# Patient Record
Sex: Female | Born: 1935 | Race: Black or African American | Hispanic: No | Smoking: Never smoker
Health system: Southern US, Community
[De-identification: ages and names within clinical notes are randomized; demographics above are authoritative.]

## PROBLEM LIST (undated history)

## (undated) ENCOUNTER — Emergency Department (HOSPITAL_BASED_OUTPATIENT_CLINIC_OR_DEPARTMENT_OTHER): Admission: EM | Payer: Medicare Other | Source: Home / Self Care

## (undated) DIAGNOSIS — I10 Essential (primary) hypertension: Secondary | ICD-10-CM

## (undated) DIAGNOSIS — K922 Gastrointestinal hemorrhage, unspecified: Secondary | ICD-10-CM

## (undated) DIAGNOSIS — N289 Disorder of kidney and ureter, unspecified: Secondary | ICD-10-CM

## (undated) HISTORY — PX: ABDOMINAL SURGERY: SHX537

## (undated) HISTORY — PX: ANKLE SURGERY: SHX546

## (undated) HISTORY — PX: ABDOMINAL HYSTERECTOMY: SHX81

## (undated) HISTORY — PX: APPENDECTOMY: SHX54

---

## 2008-10-06 ENCOUNTER — Ambulatory Visit: Payer: Self-pay | Admitting: Diagnostic Radiology

## 2008-10-06 ENCOUNTER — Emergency Department (HOSPITAL_BASED_OUTPATIENT_CLINIC_OR_DEPARTMENT_OTHER): Admission: EM | Admit: 2008-10-06 | Discharge: 2008-10-06 | Payer: Self-pay | Admitting: Emergency Medicine

## 2009-01-15 ENCOUNTER — Emergency Department (HOSPITAL_BASED_OUTPATIENT_CLINIC_OR_DEPARTMENT_OTHER): Admission: EM | Admit: 2009-01-15 | Discharge: 2009-01-16 | Payer: Self-pay | Admitting: Emergency Medicine

## 2009-01-16 ENCOUNTER — Ambulatory Visit: Payer: Self-pay | Admitting: Diagnostic Radiology

## 2009-02-20 ENCOUNTER — Ambulatory Visit: Payer: Self-pay | Admitting: Diagnostic Radiology

## 2009-02-20 ENCOUNTER — Ambulatory Visit (HOSPITAL_BASED_OUTPATIENT_CLINIC_OR_DEPARTMENT_OTHER): Admission: RE | Admit: 2009-02-20 | Discharge: 2009-02-20 | Payer: Self-pay | Admitting: Internal Medicine

## 2010-02-26 ENCOUNTER — Ambulatory Visit (HOSPITAL_BASED_OUTPATIENT_CLINIC_OR_DEPARTMENT_OTHER)
Admission: RE | Admit: 2010-02-26 | Discharge: 2010-02-26 | Payer: Self-pay | Source: Home / Self Care | Attending: Internal Medicine | Admitting: Internal Medicine

## 2010-05-28 LAB — URINALYSIS, ROUTINE W REFLEX MICROSCOPIC
Glucose, UA: NEGATIVE mg/dL
Protein, ur: NEGATIVE mg/dL
Specific Gravity, Urine: 1.005 (ref 1.005–1.030)
pH: 5.5 (ref 5.0–8.0)

## 2010-05-28 LAB — URINE CULTURE
Colony Count: NO GROWTH
Culture: NO GROWTH

## 2010-05-28 LAB — CBC
HCT: 34.3 % — ABNORMAL LOW (ref 36.0–46.0)
MCHC: 33.9 g/dL (ref 30.0–36.0)
MCV: 90.3 fL (ref 78.0–100.0)
RBC: 3.79 MIL/uL — ABNORMAL LOW (ref 3.87–5.11)
WBC: 5.1 10*3/uL (ref 4.0–10.5)

## 2010-05-28 LAB — BASIC METABOLIC PANEL
BUN: 16 mg/dL (ref 6–23)
CO2: 22 mEq/L (ref 19–32)
Chloride: 98 mEq/L (ref 96–112)
GFR calc Af Amer: 37 mL/min — ABNORMAL LOW (ref >60)
Potassium: 4.6 mEq/L (ref 3.5–5.1)

## 2010-05-28 LAB — DIFFERENTIAL
Eosinophils Absolute: 0.1 10*3/uL (ref 0.0–0.7)
Eosinophils Relative: 2 % (ref 0–5)
Lymphs Abs: 2.4 10*3/uL (ref 0.7–4.0)
Monocytes Relative: 7 % (ref 3–12)

## 2010-06-01 LAB — BASIC METABOLIC PANEL
BUN: 22 mg/dL (ref 6–23)
GFR calc non Af Amer: 22 mL/min — ABNORMAL LOW (ref >60)
Glucose, Bld: 100 mg/dL — ABNORMAL HIGH (ref 70–99)
Potassium: 5.6 mEq/L — ABNORMAL HIGH (ref 3.5–5.1)

## 2010-06-01 LAB — BASIC METABOLIC PANEL WITH GFR
CO2: 28 meq/L (ref 19–32)
Calcium: 9.7 mg/dL (ref 8.4–10.5)
Chloride: 105 meq/L (ref 96–112)
Creatinine, Ser: 2.1 mg/dL — ABNORMAL HIGH (ref 0.4–1.2)
GFR calc Af Amer: 27 mL/min — ABNORMAL LOW (ref >60)
Sodium: 140 meq/L (ref 135–145)

## 2010-06-01 LAB — POCT CARDIAC MARKERS
CKMB, poc: <1 ng/mL — ABNORMAL LOW (ref 1.0–8.0)
Myoglobin, poc: 175 ng/mL (ref 12–200)
Troponin i, poc: <0.05 ng/mL (ref 0.00–0.09)

## 2010-06-01 LAB — CBC
HCT: 32 % — ABNORMAL LOW (ref 36.0–46.0)
Hemoglobin: 10.5 g/dL — ABNORMAL LOW (ref 12.0–15.0)
MCHC: 32.8 g/dL (ref 30.0–36.0)
MCV: 92.2 fL (ref 78.0–100.0)
Platelets: 229 10*3/uL (ref 150–400)
RBC: 3.47 MIL/uL — ABNORMAL LOW (ref 3.87–5.11)
RDW: 13.1 % (ref 11.5–15.5)
WBC: 4.4 K/uL (ref 4.0–10.5)

## 2010-06-01 LAB — DIFFERENTIAL
Basophils Absolute: 0 10*3/uL (ref 0.0–0.1)
Basophils Relative: 1 % (ref 0–1)
Eosinophils Absolute: 0.1 10*3/uL (ref 0.0–0.7)
Eosinophils Relative: 2 % (ref 0–5)
Lymphocytes Relative: 37 % (ref 12–46)
Lymphs Abs: 1.6 K/uL (ref 0.7–4.0)
Monocytes Absolute: 0.4 K/uL (ref 0.1–1.0)
Monocytes Relative: 8 % (ref 3–12)
Neutro Abs: 2.2 K/uL (ref 1.7–7.7)
Neutrophils Relative %: 52 % (ref 43–77)

## 2010-06-01 LAB — POTASSIUM: Potassium: 5.4 mEq/L — ABNORMAL HIGH (ref 3.5–5.1)

## 2011-01-23 ENCOUNTER — Other Ambulatory Visit (HOSPITAL_BASED_OUTPATIENT_CLINIC_OR_DEPARTMENT_OTHER): Payer: Self-pay | Admitting: Internal Medicine

## 2011-01-23 DIAGNOSIS — Z1231 Encounter for screening mammogram for malignant neoplasm of breast: Secondary | ICD-10-CM

## 2011-03-05 ENCOUNTER — Ambulatory Visit (HOSPITAL_BASED_OUTPATIENT_CLINIC_OR_DEPARTMENT_OTHER)
Admission: RE | Admit: 2011-03-05 | Discharge: 2011-03-05 | Disposition: A | Discharge status: Home / Self Care | Payer: Medicare Other | Source: Ambulatory Visit | Attending: Internal Medicine | Admitting: Internal Medicine

## 2011-03-05 ENCOUNTER — Ambulatory Visit (HOSPITAL_BASED_OUTPATIENT_CLINIC_OR_DEPARTMENT_OTHER): Payer: Self-pay

## 2011-03-05 DIAGNOSIS — Z1231 Encounter for screening mammogram for malignant neoplasm of breast: Secondary | ICD-10-CM | POA: Insufficient documentation

## 2011-03-09 ENCOUNTER — Encounter (HOSPITAL_BASED_OUTPATIENT_CLINIC_OR_DEPARTMENT_OTHER): Payer: Self-pay | Admitting: Family Medicine

## 2011-03-09 ENCOUNTER — Emergency Department (INDEPENDENT_AMBULATORY_CARE_PROVIDER_SITE_OTHER): Payer: Medicare Other

## 2011-03-09 ENCOUNTER — Emergency Department (HOSPITAL_BASED_OUTPATIENT_CLINIC_OR_DEPARTMENT_OTHER)
Admission: EM | Admit: 2011-03-09 | Discharge: 2011-03-09 | Disposition: A | Discharge status: Home / Self Care | Payer: Medicare Other | Attending: Emergency Medicine | Admitting: Emergency Medicine

## 2011-03-09 ENCOUNTER — Other Ambulatory Visit: Payer: Self-pay

## 2011-03-09 DIAGNOSIS — R300 Dysuria: Secondary | ICD-10-CM | POA: Insufficient documentation

## 2011-03-09 DIAGNOSIS — R079 Chest pain, unspecified: Secondary | ICD-10-CM | POA: Insufficient documentation

## 2011-03-09 DIAGNOSIS — I10 Essential (primary) hypertension: Secondary | ICD-10-CM | POA: Insufficient documentation

## 2011-03-09 DIAGNOSIS — IMO0001 Reserved for inherently not codable concepts without codable children: Secondary | ICD-10-CM | POA: Insufficient documentation

## 2011-03-09 DIAGNOSIS — N390 Urinary tract infection, site not specified: Secondary | ICD-10-CM | POA: Insufficient documentation

## 2011-03-09 HISTORY — DX: Disorder of kidney and ureter, unspecified: N28.9

## 2011-03-09 HISTORY — DX: Gastrointestinal hemorrhage, unspecified: K92.2

## 2011-03-09 HISTORY — DX: Essential (primary) hypertension: I10

## 2011-03-09 LAB — CARDIAC PANEL(CRET KIN+CKTOT+MB+TROPI)
CK, MB: 1.9 ng/mL (ref 0.3–4.0)
Relative Index: 1.5 (ref 0.0–2.5)
Troponin I: <0.3 ng/mL (ref <0.30)
Troponin I: <0.3 ng/mL (ref <0.30)

## 2011-03-09 LAB — COMPREHENSIVE METABOLIC PANEL
ALT: 17 U/L (ref 0–35)
Alkaline Phosphatase: 83 U/L (ref 39–117)
CO2: 25 mEq/L (ref 19–32)
Chloride: 107 mEq/L (ref 96–112)
GFR calc Af Amer: 28 mL/min — ABNORMAL LOW (ref >90)
GFR calc non Af Amer: 24 mL/min — ABNORMAL LOW (ref >90)
Glucose, Bld: 87 mg/dL (ref 70–99)
Potassium: 4.9 mEq/L (ref 3.5–5.1)
Sodium: 141 mEq/L (ref 135–145)

## 2011-03-09 LAB — URINE CULTURE
Culture  Setup Time: 201301150525
Culture: NO GROWTH

## 2011-03-09 LAB — CBC
Hemoglobin: 11.3 g/dL — ABNORMAL LOW (ref 12.0–15.0)
RBC: 3.85 MIL/uL — ABNORMAL LOW (ref 3.87–5.11)
WBC: 6.4 10*3/uL (ref 4.0–10.5)

## 2011-03-09 LAB — URINE MICROSCOPIC-ADD ON

## 2011-03-09 LAB — URINALYSIS, ROUTINE W REFLEX MICROSCOPIC
Bilirubin Urine: NEGATIVE
Ketones, ur: NEGATIVE mg/dL
Nitrite: NEGATIVE
Urobilinogen, UA: 0.2 mg/dL (ref 0.0–1.0)
pH: 5.5 (ref 5.0–8.0)

## 2011-03-09 MED ORDER — CEPHALEXIN 500 MG PO CAPS: 500.0000 mg | ORAL_CAPSULE | Freq: Four times a day (QID) | ORAL | Status: AC

## 2011-03-09 MED ORDER — CEPHALEXIN 250 MG PO CAPS
500.0000 mg | ORAL_CAPSULE | Freq: Once | ORAL | Status: AC
  Administered 2011-03-09: 500 mg via ORAL
  Filled 2011-03-09: qty 2

## 2011-03-09 MED ORDER — ASPIRIN 81 MG PO CHEW
324.0000 mg | CHEWABLE_TABLET | Freq: Once | ORAL | Status: AC
  Administered 2011-03-09: 324 mg via ORAL
  Filled 2011-03-09: qty 4

## 2011-03-09 NOTE — ED Notes (Addendum)
Pt c/o "hurting all over" since yesterday and "burning in my arms and hurting some in my chest onset today. Pt is poor historian. Pt lives alone and cares for herself. Pt sts she cannot recall if she took her meds today or not, pt sts she took meds yesterday.

## 2011-03-09 NOTE — ED Provider Notes (Signed)
History  This chart was scribed for Mia Numbers, MD by Bennett Scrape. This patient was seen in room MH03/MH03 and the patient's care was started at 5:12PM.  CSN: [id number]  Arrival date & time 03/09/11  1602   First MD Initiated Contact with Patient 03/09/11 1650      Chief Complaint  Patient presents with  . Generalized Body Aches    The history is provided by the patient. No language interpreter was used.    Mia Garcia is a 78 y.o. female who presents to the Emergency Department complaining of one week of bilateral arm pain described as a burning sensation with associated generalized body aches, mild non-productive cough and sternally located intermittent chest pain. She is not currently having chest pain now. She reports that the last time she had chest pain was 3 hours ago. She states that her symptoms have been worse since yesterday. She denies any modifying factors. She has not taken any medications to improve pain at home. Pt states that the symptoms she is experiencing now are feels different from the symptoms she experiences with heart burn. She has not seen her PCP for the symptoms. She denies having any sick contacts at home. She denies rash, SOB, nausea, vomiting and diarrhea as associated symptoms. She denies having a  h/o MI or heart stents. She states that she had a mammogram 4 days ago and is still awaiting the results.  She has a h/o HTN but doesn't remember if she has taken her medications the past few days. She denies smoking and alcohol use. She has had some recent dysuria but is planning on following up with her OB/GYN.  Pt's PCP is Dr. Ron Agee.   Past Medical History  Diagnosis Date  . Hypertension   . Renal disorder   . GI bleed     Past Surgical History  Procedure Date  . Abdominal surgery   . Abdominal hysterectomy   . Appendectomy     History reviewed. No pertinent family history.  History  Substance Use Topics  . Smoking status: Never Smoker     . Smokeless tobacco: Not on file  . Alcohol Use: No    Review of Systems  Constitutional: Negative for fever and chills.  HENT: Negative for congestion, sore throat and neck pain.   Eyes: Negative for itching.  Respiratory: Positive for cough. Negative for shortness of breath.   Cardiovascular: Positive for chest pain.  Gastrointestinal: Negative for nausea, vomiting, abdominal pain and diarrhea.  Genitourinary: Positive for dysuria. Negative for urgency and hematuria.  Musculoskeletal: Negative for back pain.  Skin: Negative for rash.  Neurological: Negative for weakness and headaches.    Allergies  Review of patient's allergies indicates no known allergies.  Home Medications   Current Outpatient Rx  Name Route Sig Dispense Refill  . ASPIRIN EC 81 MG PO TBEC Oral Take 81 mg by mouth daily.    Marland Kitchen LISINOPRIL 20 MG PO TABS Oral Take 20 mg by mouth daily.    . ADULT MULTIVITAMIN W/MINERALS CH Oral Take 1 tablet by mouth daily.    Marland Kitchen OMEPRAZOLE 20 MG PO CPDR Oral Take 20 mg by mouth daily.      Triage Vitals: BP 158/69  Pulse 85  Temp(Src) 98.5 F (36.9 C) (Oral)  Ht 5\' 4"  (1.626 m)  Wt 160 lb (72.576 kg)  BMI 27.46 kg/m2  SpO2 100%  Physical Exam  Nursing note and vitals reviewed. Constitutional: She is oriented to person, place, and  time. She appears well-developed and well-nourished.  HENT:  Head: Normocephalic and atraumatic.  Eyes: Conjunctivae and EOM are normal.  Neck: Normal range of motion. Neck supple.  Cardiovascular: Normal rate, regular rhythm and normal heart sounds.   Pulmonary/Chest: Effort normal and breath sounds normal. No respiratory distress.  Abdominal: Soft. There is no tenderness.  Musculoskeletal: Normal range of motion. She exhibits no edema.  Neurological: She is alert and oriented to person, place, and time. No cranial nerve deficit.  Skin: Skin is warm and dry. No rash noted.  Psychiatric: She has a normal mood and affect. Her behavior is  normal.    ED Course  Procedures (including critical care time)   Date: 03/09/2011  Rate: 79  Rhythm: normal sinus rhythm  QRS Axis: normal  Intervals: normal  ST/T Wave abnormalities: normal  Conduction Disutrbances:none  Narrative Interpretation:   Old EKG Reviewed: unchanged  DIAGNOSTIC STUDIES: Oxygen Saturation is 100% on room air, normal by my interpretation.    COORDINATION OF CARE: 5:19PM-Discussed urinalysis and chest x-ray with pt and pt agreed to plan.   Labs Reviewed  CBC - Abnormal; Notable for the following:    RBC 3.85 (*)    Hemoglobin 11.3 (*)    HCT 35.4 (*)    All other components within normal limits  COMPREHENSIVE METABOLIC PANEL - Abnormal; Notable for the following:    Creatinine, Ser 1.80 (*)    GFR calc non Af Amer 24 (*)    GFR calc Af Amer 28 (*)    All other components within normal limits  URINALYSIS, ROUTINE W REFLEX MICROSCOPIC - Abnormal; Notable for the following:    Leukocytes, UA SMALL (*)    All other components within normal limits  URINE MICROSCOPIC-ADD ON - Abnormal; Notable for the following:    Bacteria, UA FEW (*)    All other components within normal limits  CARDIAC PANEL(CRET KIN+CKTOT+MB+TROPI)  CARDIAC PANEL(CRET KIN+CKTOT+MB+TROPI)  URINE CULTURE   Dg Chest 2 View  03/09/2011  *RADIOLOGY REPORT*  Clinical Data: Diffuse pain including chest pain.  CHEST - 2 VIEW  Comparison: 01/16/2009  Findings: The heart size and vascularity are normal and the lungs are clear.  No effusions.  No acute osseous abnormality.  IMPRESSION: No acute disease in the chest.  Original Report Authenticated By: Gwynn Burly, M.D.     1. UTI (lower urinary tract infection)   2. Chest pain       MDM  Patient was evaluated. Given her presentation and had very low suspicion for ACS. However given that the patient is female and also in her 68s we did perform cardiac workup. EKG was unremarkable as was chest x-ray. Laboratory workup was  negative including negative markers at 0 and 3 hours. Patient did receive aspirin while awaiting results. She remains chest pain-free while here in the emergency department. Given her dysuria a urinalysis was performed and was positive for UTI. She was treated with Keflex. She'll be discharged with 10 days of this. Urine culture was also sent. Patient was told to followup with her regular Dr. Patient was discharged home in good condition.     I personally performed the services described in this documentation, which was scribed in my presence. The recorded information has been reviewed and considered.      Mia Numbers, MD 03/10/11 [phone]

## 2011-09-11 ENCOUNTER — Encounter (HOSPITAL_BASED_OUTPATIENT_CLINIC_OR_DEPARTMENT_OTHER): Payer: Self-pay | Admitting: *Deleted

## 2011-09-11 ENCOUNTER — Emergency Department (HOSPITAL_BASED_OUTPATIENT_CLINIC_OR_DEPARTMENT_OTHER): Payer: Medicare Other

## 2011-09-11 ENCOUNTER — Emergency Department (HOSPITAL_BASED_OUTPATIENT_CLINIC_OR_DEPARTMENT_OTHER)
Admission: EM | Admit: 2011-09-11 | Discharge: 2011-09-11 | Disposition: A | Discharge status: Other Acute Inpatient Hospital | Payer: Medicare Other | Attending: Emergency Medicine | Admitting: Emergency Medicine

## 2011-09-11 DIAGNOSIS — Z9089 Acquired absence of other organs: Secondary | ICD-10-CM | POA: Insufficient documentation

## 2011-09-11 DIAGNOSIS — I10 Essential (primary) hypertension: Secondary | ICD-10-CM | POA: Insufficient documentation

## 2011-09-11 DIAGNOSIS — R079 Chest pain, unspecified: Secondary | ICD-10-CM

## 2011-09-11 LAB — CBC WITH DIFFERENTIAL/PLATELET
Basophils Absolute: 0 10*3/uL (ref 0.0–0.1)
Basophils Relative: 1 % (ref 0–1)
HCT: 32.9 % — ABNORMAL LOW (ref 36.0–46.0)
Lymphocytes Relative: 42 % (ref 12–46)
MCHC: 32.8 g/dL (ref 30.0–36.0)
Monocytes Absolute: 0.4 10*3/uL (ref 0.1–1.0)
Neutro Abs: 2.2 10*3/uL (ref 1.7–7.7)
Neutrophils Relative %: 45 % (ref 43–77)
RDW: 13 % (ref 11.5–15.5)
WBC: 4.8 10*3/uL (ref 4.0–10.5)

## 2011-09-11 LAB — CK TOTAL AND CKMB (NOT AT ARMC): CK, MB: 1.9 ng/mL (ref 0.3–4.0)

## 2011-09-11 LAB — URINALYSIS, ROUTINE W REFLEX MICROSCOPIC
Glucose, UA: NEGATIVE mg/dL
Hgb urine dipstick: NEGATIVE
Protein, ur: NEGATIVE mg/dL
Specific Gravity, Urine: 1.005 (ref 1.005–1.030)

## 2011-09-11 LAB — URINE MICROSCOPIC-ADD ON

## 2011-09-11 LAB — COMPREHENSIVE METABOLIC PANEL
ALT: 17 U/L (ref 0–35)
AST: 27 U/L (ref 0–37)
Albumin: 3.4 g/dL — ABNORMAL LOW (ref 3.5–5.2)
Alkaline Phosphatase: 82 U/L (ref 39–117)
CO2: 30 mEq/L (ref 19–32)
Chloride: 100 mEq/L (ref 96–112)
GFR calc non Af Amer: 17 mL/min — ABNORMAL LOW (ref >90)
Potassium: 4.4 mEq/L (ref 3.5–5.1)
Sodium: 138 mEq/L (ref 135–145)
Total Bilirubin: 0.4 mg/dL (ref 0.3–1.2)

## 2011-09-11 MED ORDER — GI COCKTAIL ~~LOC~~
30.0000 mL | Freq: Once | ORAL | Status: AC
  Administered 2011-09-11: 30 mL via ORAL
  Filled 2011-09-11: qty 30

## 2011-09-11 MED ORDER — ASPIRIN 81 MG PO CHEW
324.0000 mg | CHEWABLE_TABLET | Freq: Once | ORAL | Status: AC
  Administered 2011-09-11: 324 mg via ORAL
  Filled 2011-09-11: qty 4

## 2011-09-11 MED ORDER — NITROGLYCERIN 0.4 MG SL SUBL
0.4000 mg | SUBLINGUAL_TABLET | SUBLINGUAL | Status: DC | PRN
  Administered 2011-09-11 (×2): 0.4 mg via SUBLINGUAL
  Filled 2011-09-11: qty 25

## 2011-09-11 NOTE — ED Notes (Addendum)
Pt reports onset of intermittent sharp chest pain that occurred at 1600 today. Stated nothing makes it worse or better. States that the pain is throughout her chest but denies the pain radiating to any other area. Stated that when it first occurred she, "got hot." Stated that at 1600 it was 10 out of 10 and currently is 7 out of 10. Denies N/V, diaphoresis and shortness of breath.

## 2011-09-11 NOTE — ED Provider Notes (Signed)
History     CSN: [id number]  Arrival date & time 09/11/11  1914   First MD Initiated Contact with Patient 09/11/11 1948      Chief Complaint  Patient presents with  . Chest Pain    (Consider location/radiation/quality/duration/timing/severity/associated sxs/prior treatment) HPI  Chest pain began this evening about 4 PM. She states it feels like indigestion. It radiates across her chest. She does not have any dyspnea or lightheadedness or sweating with this. She thinks that she may have had one episode around a year ago but does not think that she was in the hospital for this. Patient lives alone but is here with family.  Past Medical History  Diagnosis Date  . Hypertension   . Renal disorder   . GI bleed     Past Surgical History  Procedure Date  . Abdominal surgery   . Abdominal hysterectomy   . Appendectomy     No family history on file.  History  Substance Use Topics  . Smoking status: Never Smoker   . Smokeless tobacco: Not on file  . Alcohol Use: No    OB History    Grav Para Term Preterm Abortions TAB SAB Ect Mult Living                  Review of Systems  All other systems reviewed and are negative.    Allergies  Review of patient's allergies indicates no known allergies.  Home Medications   Current Outpatient Rx  Name Route Sig Dispense Refill  . ALUM & MAG HYDROXIDE-SIMETH 500-450-40 MG/5ML PO SUSP Oral Take 5 mLs by mouth every 6 (six) hours as needed. For heartburn.    . ASPIRIN EC 81 MG PO TBEC Oral Take 81 mg by mouth daily.    Marland Kitchen LANSOPRAZOLE 15 MG PO CPDR Oral Take 15 mg by mouth daily. For acid reflux.    . ADULT MULTIVITAMIN W/MINERALS CH Oral Take 1 tablet by mouth daily.    Marland Kitchen PRAVASTATIN SODIUM 40 MG PO TABS Oral Take 40 mg by mouth daily.      BP 150/75  Pulse 88  Temp 98.3 F (36.8 C) (Oral)  Resp 20  SpO2 98%  Physical Exam  Nursing note and vitals reviewed. Constitutional: She appears well-developed and well-nourished.    HENT:  Head: Normocephalic and atraumatic.  Eyes: Conjunctivae and EOM are normal. Pupils are equal, round, and reactive to light.  Neck: Normal range of motion. Neck supple.  Cardiovascular: Normal rate, regular rhythm, normal heart sounds and intact distal pulses.   Pulmonary/Chest: Effort normal and breath sounds normal.  Abdominal: Soft. Bowel sounds are normal.  Musculoskeletal: Normal range of motion.  Neurological: She is alert.       Patient alert and oriented but has some intermittent confusion and forgetfulness.  Skin: Skin is warm and dry.  Psychiatric: She has a normal mood and affect. Thought content normal.    ED Course  Procedures (including critical care time)  Labs Reviewed  CBC WITH DIFFERENTIAL - Abnormal; Notable for the following:    RBC 3.61 (*)     Hemoglobin 10.8 (*)     HCT 32.9 (*)     All other components within normal limits  COMPREHENSIVE METABOLIC PANEL - Abnormal; Notable for the following:    Glucose, Bld 105 (*)     Creatinine, Ser 2.40 (*)     Albumin 3.4 (*)     GFR calc non Af Amer 17 (*)  GFR calc Af Amer 20 (*)     All other components within normal limits  URINALYSIS, ROUTINE W REFLEX MICROSCOPIC - Abnormal; Notable for the following:    Leukocytes, UA SMALL (*)     All other components within normal limits  CK TOTAL AND CKMB  TROPONIN I  URINE MICROSCOPIC-ADD ON   Dg Chest Port 1 View  09/11/2011  *RADIOLOGY REPORT*  Clinical Data: Mid chest pain  PORTABLE CHEST - 1 VIEW  Comparison: 03/09/2011  Findings: Lungs are clear. No pleural effusion or pneumothorax.  The heart is top normal in size/mildly enlarged.  IMPRESSION: No evidence of acute cardiopulmonary disease.  Stable borderline cardiomegaly.  Original Report Authenticated By: Charline Bills, M.D.     No diagnosis found.    Date: 09/11/2011  Rate: 81  Rhythm: normal sinus rhythm  QRS Axis: normal  Intervals: normal  ST/T Wave abnormalities: nonspecific ST changes   Conduction Disutrbances:none  Narrative Interpretation:   Old EKG Reviewed: none available Dg Chest Port 1 View  09/11/2011  *RADIOLOGY REPORT*  Clinical Data: Mid chest pain  PORTABLE CHEST - 1 VIEW  Comparison: 03/09/2011  Findings: Lungs are clear. No pleural effusion or pneumothorax.  The heart is top normal in size/mildly enlarged.  IMPRESSION: No evidence of acute cardiopulmonary disease.  Stable borderline cardiomegaly.  Original Report Authenticated By: Charline Bills, M.D.    Results for orders placed during the hospital encounter of 09/11/11  CBC WITH DIFFERENTIAL      Component Value Range   WBC 4.8  4.0 - 10.5 K/uL   RBC 3.61 (*) 3.87 - 5.11 MIL/uL   Hemoglobin 10.8 (*) 12.0 - 15.0 g/dL   HCT 16.1 (*) 09.6 - 04.5 %   MCV 91.1  78.0 - 100.0 fL   MCH 29.9  26.0 - 34.0 pg   MCHC 32.8  30.0 - 36.0 g/dL   RDW 40.9  81.1 - 91.4 %   Platelets 188  150 - 400 K/uL   Neutrophils Relative 45  43 - 77 %   Neutro Abs 2.2  1.7 - 7.7 K/uL   Lymphocytes Relative 42  12 - 46 %   Lymphs Abs 2.0  0.7 - 4.0 K/uL   Monocytes Relative 9  3 - 12 %   Monocytes Absolute 0.4  0.1 - 1.0 K/uL   Eosinophils Relative 3  0 - 5 %   Eosinophils Absolute 0.1  0.0 - 0.7 K/uL   Basophils Relative 1  0 - 1 %   Basophils Absolute 0.0  0.0 - 0.1 K/uL  COMPREHENSIVE METABOLIC PANEL      Component Value Range   Sodium 138  135 - 145 mEq/L   Potassium 4.4  3.5 - 5.1 mEq/L   Chloride 100  96 - 112 mEq/L   CO2 30  19 - 32 mEq/L   Glucose, Bld 105 (*) 70 - 99 mg/dL   BUN 22  6 - 23 mg/dL   Creatinine, Ser 7.82 (*) 0.50 - 1.10 mg/dL   Calcium 9.4  8.4 - 95.6 mg/dL   Total Protein 7.0  6.0 - 8.3 g/dL   Albumin 3.4 (*) 3.5 - 5.2 g/dL   AST 27  0 - 37 U/L   ALT 17  0 - 35 U/L   Alkaline Phosphatase 82  39 - 117 U/L   Total Bilirubin 0.4  0.3 - 1.2 mg/dL   GFR calc non Af Amer 17 (*) >90 mL/min   GFR calc Af  Amer 20 (*) >90 mL/min  CK TOTAL AND CKMB      Component Value Range   Total CK 94  7 - 177 U/L     CK, MB 1.9  0.3 - 4.0 ng/mL   Relative Index RELATIVE INDEX IS INVALID  0.0 - 2.5  TROPONIN I      Component Value Range   Troponin I <0.30  <0.30 ng/mL  URINALYSIS, ROUTINE W REFLEX MICROSCOPIC      Component Value Range   Color, Urine YELLOW  YELLOW   APPearance CLEAR  CLEAR   Specific Gravity, Urine 1.005  1.005 - 1.030   pH 7.5  5.0 - 8.0   Glucose, UA NEGATIVE  NEGATIVE mg/dL   Hgb urine dipstick NEGATIVE  NEGATIVE   Bilirubin Urine NEGATIVE  NEGATIVE   Ketones, ur NEGATIVE  NEGATIVE mg/dL   Protein, ur NEGATIVE  NEGATIVE mg/dL   Urobilinogen, UA 0.2  0.0 - 1.0 mg/dL   Nitrite NEGATIVE  NEGATIVE   Leukocytes, UA SMALL (*) NEGATIVE  URINE MICROSCOPIC-ADD ON      Component Value Range   Squamous Epithelial / LPF RARE  RARE   WBC, UA 3-6  <3 WBC/hpf   Bacteria, UA RARE  RARE   Results for orders placed during the hospital encounter of 09/11/11  CBC WITH DIFFERENTIAL      Component Value Range   WBC 4.8  4.0 - 10.5 K/uL   RBC 3.61 (*) 3.87 - 5.11 MIL/uL   Hemoglobin 10.8 (*) 12.0 - 15.0 g/dL   HCT 96.0 (*) 45.4 - 09.8 %   MCV 91.1  78.0 - 100.0 fL   MCH 29.9  26.0 - 34.0 pg   MCHC 32.8  30.0 - 36.0 g/dL   RDW 11.9  14.7 - 82.9 %   Platelets 188  150 - 400 K/uL   Neutrophils Relative 45  43 - 77 %   Neutro Abs 2.2  1.7 - 7.7 K/uL   Lymphocytes Relative 42  12 - 46 %   Lymphs Abs 2.0  0.7 - 4.0 K/uL   Monocytes Relative 9  3 - 12 %   Monocytes Absolute 0.4  0.1 - 1.0 K/uL   Eosinophils Relative 3  0 - 5 %   Eosinophils Absolute 0.1  0.0 - 0.7 K/uL   Basophils Relative 1  0 - 1 %   Basophils Absolute 0.0  0.0 - 0.1 K/uL  COMPREHENSIVE METABOLIC PANEL      Component Value Range   Sodium 138  135 - 145 mEq/L   Potassium 4.4  3.5 - 5.1 mEq/L   Chloride 100  96 - 112 mEq/L   CO2 30  19 - 32 mEq/L   Glucose, Bld 105 (*) 70 - 99 mg/dL   BUN 22  6 - 23 mg/dL   Creatinine, Ser 5.62 (*) 0.50 - 1.10 mg/dL   Calcium 9.4  8.4 - 13.0 mg/dL   Total Protein 7.0  6.0 - 8.3  g/dL   Albumin 3.4 (*) 3.5 - 5.2 g/dL   AST 27  0 - 37 U/L   ALT 17  0 - 35 U/L   Alkaline Phosphatase 82  39 - 117 U/L   Total Bilirubin 0.4  0.3 - 1.2 mg/dL   GFR calc non Af Amer 17 (*) >90 mL/min   GFR calc Af Amer 20 (*) >90 mL/min  CK TOTAL AND CKMB      Component Value Range   Total  CK 94  7 - 177 U/L   CK, MB 1.9  0.3 - 4.0 ng/mL   Relative Index RELATIVE INDEX IS INVALID  0.0 - 2.5  TROPONIN I      Component Value Range   Troponin I <0.30  <0.30 ng/mL  URINALYSIS, ROUTINE W REFLEX MICROSCOPIC      Component Value Range   Color, Urine YELLOW  YELLOW   APPearance CLEAR  CLEAR   Specific Gravity, Urine 1.005  1.005 - 1.030   pH 7.5  5.0 - 8.0   Glucose, UA NEGATIVE  NEGATIVE mg/dL   Hgb urine dipstick NEGATIVE  NEGATIVE   Bilirubin Urine NEGATIVE  NEGATIVE   Ketones, ur NEGATIVE  NEGATIVE mg/dL   Protein, ur NEGATIVE  NEGATIVE mg/dL   Urobilinogen, UA 0.2  0.0 - 1.0 mg/dL   Nitrite NEGATIVE  NEGATIVE   Leukocytes, UA SMALL (*) NEGATIVE  URINE MICROSCOPIC-ADD ON      Component Value Range   Squamous Epithelial / LPF RARE  RARE   WBC, UA 3-6  <3 WBC/hpf   Bacteria, UA RARE  RARE     MDM  Patient with waxing and waning pain and is difficult to evaluate here. She has a no acute changes on her EKG and troponin is normal. She has been given aspirin and 2 subungual nitroglycerin and GI cocktail. Her creatinine is elevated at 2.4 and that it is reported that she has a history of renal insufficiency but there is no baseline creatinine to compare this to. She is also slightly anemic with a hemoglobin of 10.8 and again there is no hemoglobin available for comparison. I discussed patient's care with Dr. Thera Flake at [facility] and patient will be transferred there to a monitored bed. Patient remained hemodynamically stable here and currently has no complaints.        Hilario Quarry, MD 09/11/11 [phone]

## 2011-09-11 NOTE — ED Notes (Signed)
Pt. Is only reporting a "burning" in the R side of her chest.  Pt. Reports she has no pain.  Pt. Tells Dr. Rosalia Hammers that the pain is a 7/10 and then tells RN Earlene Plater that she is burning because I gave her the NTG under her tongue.  Pt. Niece is at bedside.  Pt. Speaks at times like she has dementia.

## 2011-09-11 NOTE — ED Notes (Signed)
Chest pain feels like indigestion x 3 hours. Hx of same pain that her MD gave her medication to make her food digest.

## 2012-02-01 ENCOUNTER — Other Ambulatory Visit (HOSPITAL_BASED_OUTPATIENT_CLINIC_OR_DEPARTMENT_OTHER): Payer: Self-pay | Admitting: Internal Medicine

## 2012-02-01 DIAGNOSIS — Z1231 Encounter for screening mammogram for malignant neoplasm of breast: Secondary | ICD-10-CM

## 2012-03-08 ENCOUNTER — Ambulatory Visit (HOSPITAL_BASED_OUTPATIENT_CLINIC_OR_DEPARTMENT_OTHER)
Admission: RE | Admit: 2012-03-08 | Discharge: 2012-03-08 | Disposition: A | Discharge status: Home / Self Care | Payer: Medicare Other | Source: Ambulatory Visit | Attending: Internal Medicine | Admitting: Internal Medicine

## 2012-03-08 DIAGNOSIS — Z1231 Encounter for screening mammogram for malignant neoplasm of breast: Secondary | ICD-10-CM | POA: Insufficient documentation

## 2013-01-05 ENCOUNTER — Emergency Department (HOSPITAL_BASED_OUTPATIENT_CLINIC_OR_DEPARTMENT_OTHER)
Admission: EM | Admit: 2013-01-05 | Discharge: 2013-01-05 | Disposition: A | Discharge status: Other Acute Inpatient Hospital | Payer: Medicare Other | Attending: Emergency Medicine | Admitting: Emergency Medicine

## 2013-01-05 ENCOUNTER — Encounter (HOSPITAL_BASED_OUTPATIENT_CLINIC_OR_DEPARTMENT_OTHER): Payer: Self-pay | Admitting: Emergency Medicine

## 2013-01-05 ENCOUNTER — Emergency Department (HOSPITAL_BASED_OUTPATIENT_CLINIC_OR_DEPARTMENT_OTHER): Payer: Medicare Other

## 2013-01-05 DIAGNOSIS — R079 Chest pain, unspecified: Secondary | ICD-10-CM | POA: Insufficient documentation

## 2013-01-05 DIAGNOSIS — Z8719 Personal history of other diseases of the digestive system: Secondary | ICD-10-CM | POA: Insufficient documentation

## 2013-01-05 DIAGNOSIS — Z79899 Other long term (current) drug therapy: Secondary | ICD-10-CM | POA: Insufficient documentation

## 2013-01-05 DIAGNOSIS — Z87448 Personal history of other diseases of urinary system: Secondary | ICD-10-CM | POA: Insufficient documentation

## 2013-01-05 DIAGNOSIS — Z7982 Long term (current) use of aspirin: Secondary | ICD-10-CM | POA: Insufficient documentation

## 2013-01-05 DIAGNOSIS — R51 Headache: Secondary | ICD-10-CM | POA: Insufficient documentation

## 2013-01-05 DIAGNOSIS — M542 Cervicalgia: Secondary | ICD-10-CM | POA: Insufficient documentation

## 2013-01-05 DIAGNOSIS — R059 Cough, unspecified: Secondary | ICD-10-CM | POA: Insufficient documentation

## 2013-01-05 DIAGNOSIS — I10 Essential (primary) hypertension: Secondary | ICD-10-CM | POA: Insufficient documentation

## 2013-01-05 DIAGNOSIS — R05 Cough: Secondary | ICD-10-CM | POA: Insufficient documentation

## 2013-01-05 LAB — CBC WITH DIFFERENTIAL/PLATELET
Basophils Absolute: 0 10*3/uL (ref 0.0–0.1)
Eosinophils Absolute: 0.3 10*3/uL (ref 0.0–0.7)
Eosinophils Relative: 5 % (ref 0–5)
HCT: 32.3 % — ABNORMAL LOW (ref 36.0–46.0)
Hemoglobin: 10.6 g/dL — ABNORMAL LOW (ref 12.0–15.0)
Lymphocytes Relative: 45 % (ref 12–46)
Lymphs Abs: 2.2 10*3/uL (ref 0.7–4.0)
MCH: 30 pg (ref 26.0–34.0)
MCHC: 32.8 g/dL (ref 30.0–36.0)
MCV: 91.5 fL (ref 78.0–100.0)
Monocytes Absolute: 0.6 10*3/uL (ref 0.1–1.0)
Monocytes Relative: 11 % (ref 3–12)
Platelets: 158 10*3/uL (ref 150–400)
RBC: 3.53 MIL/uL — ABNORMAL LOW (ref 3.87–5.11)

## 2013-01-05 LAB — COMPREHENSIVE METABOLIC PANEL
ALT: 20 U/L (ref 0–35)
AST: 33 U/L (ref 0–37)
BUN: 18 mg/dL (ref 6–23)
CO2: 25 mEq/L (ref 19–32)
Calcium: 9.5 mg/dL (ref 8.4–10.5)
GFR calc Af Amer: 32 mL/min — ABNORMAL LOW (ref >90)
GFR calc non Af Amer: 28 mL/min — ABNORMAL LOW (ref >90)
Glucose, Bld: 111 mg/dL — ABNORMAL HIGH (ref 70–99)
Potassium: 3.7 mEq/L (ref 3.5–5.1)
Total Bilirubin: 0.5 mg/dL (ref 0.3–1.2)
Total Protein: 7 g/dL (ref 6.0–8.3)

## 2013-01-05 LAB — D-DIMER, QUANTITATIVE: D-Dimer, Quant: 0.52 ug/mL-FEU — ABNORMAL HIGH (ref 0.00–0.48)

## 2013-01-05 LAB — TROPONIN I: Troponin I: <0.3 ng/mL (ref <0.30)

## 2013-01-05 MED ORDER — SODIUM CHLORIDE 0.9 % IV SOLN
INTRAVENOUS | Status: DC
  Administered 2013-01-05: 20:00:00 via INTRAVENOUS

## 2013-01-05 MED ORDER — ASPIRIN 81 MG PO CHEW
162.0000 mg | CHEWABLE_TABLET | Freq: Once | ORAL | Status: AC
  Administered 2013-01-05: 162 mg via ORAL
  Filled 2013-01-05: qty 2

## 2013-01-05 NOTE — ED Notes (Signed)
Report given to carelinke and Development worker, community at [facility]

## 2013-01-05 NOTE — ED Notes (Signed)
Patient changed into gown, waist up.  RN at bedside for triage.

## 2013-01-05 NOTE — ED Provider Notes (Signed)
CSN: [id number]     Arrival date & time 01/05/13  4098 History  This chart was scribed for Shelda Jakes, MD by Ronal Fear, ED Scribe. This patient was seen in room MH08/MH08 and the patient's care was started at 7:53 PM.    Chief Complaint  Patient presents with  . Chest Pain   (Consider location/radiation/quality/duration/timing/severity/associated sxs/prior Treatment) Patient is a 79 y.o. female presenting with chest pain. The history is provided by the patient. No language interpreter was used.  Chest Pain Pain location:  Substernal area Pain quality: sharp   Pain radiates to:  Does not radiate Pain radiates to the back: no   Pain severity:  Severe Onset quality:  Sudden Duration:  30 minutes Timing:  Constant Progression:  Resolved Chronicity:  Recurrent Relieved by: guinger ale. Worsened by:  Nothing tried Associated symptoms: cough and headache   Associated symptoms: no abdominal pain, no back pain, no diaphoresis, no dizziness, no fever, no nausea, no shortness of breath and not vomiting   HPI Comments: Mia Garcia is a 79 y.o. female who presents to the Emergency Department complaining of sharp mid sternal 10/10 chest pain that did not radiate onset around 6pm tonight that resolved after drinking a ginger ale. Pt has had one prior occurrence that was indigestion. Pt was seen for CP in July at [facility] and was admitted.  She did not undergo a stress test or any other heart marker test. Pt denies nausea and vomiting.   Past Medical History  Diagnosis Date  . Hypertension   . Renal disorder   . GI bleed    Past Surgical History  Procedure Laterality Date  . Abdominal surgery    . Abdominal hysterectomy    . Appendectomy     No family history on file. History  Substance Use Topics  . Smoking status: Never Smoker   . Smokeless tobacco: Not on file  . Alcohol Use: No   OB History   Grav Para Term Preterm Abortions TAB SAB Ect Mult Living                  Review of Systems  Constitutional: Negative for fever, chills and diaphoresis.  HENT: Negative for congestion, rhinorrhea and sore throat.   Eyes: Negative for visual disturbance.  Respiratory: Positive for cough. Negative for shortness of breath.   Cardiovascular: Positive for chest pain. Negative for leg swelling.  Gastrointestinal: Negative for nausea, vomiting, abdominal pain and diarrhea.  Genitourinary: Negative for dysuria.  Musculoskeletal: Positive for neck pain. Negative for back pain and joint swelling.  Skin: Negative for rash.  Neurological: Positive for headaches. Negative for dizziness.  Hematological: Does not bruise/bleed easily.       Pt is not on blood thinners  All other systems reviewed and are negative.    Allergies  Review of patient's allergies indicates no known allergies.  Home Medications   Current Outpatient Rx  Name  Route  Sig  Dispense  Refill  . aluminum & magnesium hydroxide-simethicone (MYLANTA) 500-450-40 MG/5ML suspension   Oral   Take 5 mLs by mouth every 6 (six) hours as needed. For heartburn.         Marland Kitchen aspirin EC 81 MG tablet   Oral   Take 81 mg by mouth daily.         . lansoprazole (PREVACID) 15 MG capsule   Oral   Take 15 mg by mouth daily. For acid reflux.         Marland Kitchen  Multiple Vitamin (MULITIVITAMIN WITH MINERALS) TABS   Oral   Take 1 tablet by mouth daily.         . pravastatin (PRAVACHOL) 40 MG tablet   Oral   Take 40 mg by mouth daily.          BP 169/93  Pulse 73  Temp(Src) 97.9 F (36.6 C) (Oral)  Resp 12  Ht 5\' 2"  (1.575 m)  Wt 160 lb (72.576 kg)  BMI 29.26 kg/m2  SpO2 100% Physical Exam  Nursing note and vitals reviewed. Constitutional: She is oriented to person, place, and time. She appears well-developed and well-nourished. No distress.  HENT:  Head: Normocephalic and atraumatic.  Mouth/Throat: Oropharynx is clear and moist.  Eyes: Conjunctivae and EOM are normal. Pupils are equal,  round, and reactive to light.  Neck: Normal range of motion. Neck supple. No tracheal deviation present.  Cardiovascular: Normal rate, regular rhythm and normal heart sounds.   Pulmonary/Chest: Effort normal and breath sounds normal. No respiratory distress. She has no wheezes. She has no rales.  Abdominal: Soft. Bowel sounds are normal. There is no tenderness.  Musculoskeletal: Normal range of motion. She exhibits no edema.  Neurological: She is alert and oriented to person, place, and time. No cranial nerve deficit. She exhibits normal muscle tone. Coordination normal.  Skin: Skin is warm and dry.  Psychiatric: She has a normal mood and affect. Her behavior is normal.    ED Course  Procedures (including critical care time) DIAGNOSTIC STUDIES: Oxygen Saturation is 100% on RA, normal by my interpretation.    COORDINATION OF CARE:    7:58 PM- Pt advised of plan for treatment including admitting the pt to [facility] for observation, Chest X-ray, and screening test for blood clots in the lungs. Pt will be given more baby aspirin for her heart and pt agrees.  Labs Review Labs Reviewed  CBC WITH DIFFERENTIAL - Abnormal; Notable for the following:    RBC 3.53 (*)    Hemoglobin 10.6 (*)    HCT 32.3 (*)    Neutrophils Relative % 38 (*)    All other components within normal limits  COMPREHENSIVE METABOLIC PANEL - Abnormal; Notable for the following:    Glucose, Bld 111 (*)    Creatinine, Ser 1.60 (*)    Albumin 3.4 (*)    GFR calc non Af Amer 28 (*)    GFR calc Af Amer 32 (*)    All other components within normal limits  D-DIMER, QUANTITATIVE - Abnormal; Notable for the following:    D-Dimer, Quant 0.52 (*)    All other components within normal limits  TROPONIN I   Results for orders placed during the hospital encounter of 01/05/13  TROPONIN I      Result Value Range   Troponin I <0.30  <0.30 ng/mL  CBC WITH DIFFERENTIAL      Result Value Range   WBC 4.9  4.0 -  10.5 K/uL   RBC 3.53 (*) 3.87 - 5.11 MIL/uL   Hemoglobin 10.6 (*) 12.0 - 15.0 g/dL   HCT 14.7 (*) 82.9 - 56.2 %   MCV 91.5  78.0 - 100.0 fL   MCH 30.0  26.0 - 34.0 pg   MCHC 32.8  30.0 - 36.0 g/dL   RDW 13.0  86.5 - 78.4 %   Platelets 158  150 - 400 K/uL   Neutrophils Relative % 38 (*) 43 - 77 %   Neutro Abs 1.9  1.7 - 7.7  K/uL   Lymphocytes Relative 45  12 - 46 %   Lymphs Abs 2.2  0.7 - 4.0 K/uL   Monocytes Relative 11  3 - 12 %   Monocytes Absolute 0.6  0.1 - 1.0 K/uL   Eosinophils Relative 5  0 - 5 %   Eosinophils Absolute 0.3  0.0 - 0.7 K/uL   Basophils Relative 1  0 - 1 %   Basophils Absolute 0.0  0.0 - 0.1 K/uL  COMPREHENSIVE METABOLIC PANEL      Result Value Range   Sodium 138  135 - 145 mEq/L   Potassium 3.7  3.5 - 5.1 mEq/L   Chloride 103  96 - 112 mEq/L   CO2 25  19 - 32 mEq/L   Glucose, Bld 111 (*) 70 - 99 mg/dL   BUN 18  6 - 23 mg/dL   Creatinine, Ser 1.61 (*) 0.50 - 1.10 mg/dL   Calcium 9.5  8.4 - 09.6 mg/dL   Total Protein 7.0  6.0 - 8.3 g/dL   Albumin 3.4 (*) 3.5 - 5.2 g/dL   AST 33  0 - 37 U/L   ALT 20  0 - 35 U/L   Alkaline Phosphatase 77  39 - 117 U/L   Total Bilirubin 0.5  0.3 - 1.2 mg/dL   GFR calc non Af Amer 28 (*) >90 mL/min   GFR calc Af Amer 32 (*) >90 mL/min  D-DIMER, QUANTITATIVE      Result Value Range   D-Dimer, Quant 0.52 (*) 0.00 - 0.48 ug/mL-FEU    Imaging Review Dg Chest 2 View  01/05/2013   CLINICAL DATA:  Sharp midsternal chest pain.  EXAM: CHEST  2 VIEW  COMPARISON:  Chest radiograph performed 08/31/2011  FINDINGS: The lungs are well-aerated and clear. There is no evidence of focal opacification, pleural effusion or pneumothorax.  The heart is borderline normal in size; the mediastinal contour is within normal limits. No acute osseous abnormalities are seen. Clips are noted about the gastroesophageal junction.  IMPRESSION: No acute cardiopulmonary process seen.   Electronically Signed   By: Roanna Raider M.D.   On: 01/05/2013 21:33     EKG Interpretation     Ventricular Rate:  75 PR Interval:  142 QRS Duration: 86 QT Interval:  388 QTC Calculation: 433 R Axis:   63 Text Interpretation:  Sinus rhythm with Premature atrial complexes with Abberant conduction Otherwise normal ECG No significant change since last tracing except for PAC's            MDM   1. Chest pain    The patient has remained chest pain-free here in the emergency department. Patient had 30 minutes of chest pain around 6 PM. Very severe now resolved patient was given aspirin here. The pain was already gone. Patient similar episode of pain a year ago in July and was admitted to [facility]. Patient has no known coronary artery disease.  Patient's workup here without significant findings troponin was negative EKG without acute changes. Chest x-ray was negative for pneumonia congestive heart failure pneumothorax or pulmonary edema. Patient's d-dimer was slightly elevated however symptoms unlikely to be related to blood clot. Patient not eligible for CT angiogram of this out admitting physician made aware of [facility]. CT scan would be in order. Patient will be transferred to [facility] via [facility].   I personally performed the services described in this documentation, which was scribed in my presence. The recorded information has  been reviewed and is accurate.     Shelda Jakes, MD 01/05/13 2229

## 2013-01-05 NOTE — ED Notes (Signed)
Describe pain as sudden onset occurred at 1800 tonight when she lay down in bed. Sharp initially. No pain currently. Denied any associated symptoms such as N/V, SOB.

## 2013-01-05 NOTE — ED Notes (Signed)
Pt in xray

## 2013-01-05 NOTE — ED Notes (Signed)
Pt reports midsternal chest pain described as sharp and lasting 30 minutes at 1800.  States pain improved after ginger-ale.  Denies pain now.

## 2013-02-09 ENCOUNTER — Other Ambulatory Visit (HOSPITAL_BASED_OUTPATIENT_CLINIC_OR_DEPARTMENT_OTHER): Payer: Self-pay | Admitting: Internal Medicine

## 2013-02-09 DIAGNOSIS — Z1231 Encounter for screening mammogram for malignant neoplasm of breast: Secondary | ICD-10-CM

## 2013-03-10 ENCOUNTER — Ambulatory Visit (HOSPITAL_BASED_OUTPATIENT_CLINIC_OR_DEPARTMENT_OTHER)
Admission: RE | Admit: 2013-03-10 | Discharge: 2013-03-10 | Disposition: A | Discharge status: Home / Self Care | Payer: Medicare Other | Source: Ambulatory Visit | Attending: Internal Medicine | Admitting: Internal Medicine

## 2013-03-10 DIAGNOSIS — Z1231 Encounter for screening mammogram for malignant neoplasm of breast: Secondary | ICD-10-CM | POA: Insufficient documentation

## 2013-05-11 ENCOUNTER — Emergency Department (HOSPITAL_BASED_OUTPATIENT_CLINIC_OR_DEPARTMENT_OTHER)
Admission: EM | Admit: 2013-05-11 | Discharge: 2013-05-11 | Disposition: A | Discharge status: Home / Self Care | Payer: Medicare Other | Attending: Emergency Medicine | Admitting: Emergency Medicine

## 2013-05-11 ENCOUNTER — Encounter (HOSPITAL_BASED_OUTPATIENT_CLINIC_OR_DEPARTMENT_OTHER): Payer: Self-pay | Admitting: Emergency Medicine

## 2013-05-11 ENCOUNTER — Emergency Department (HOSPITAL_BASED_OUTPATIENT_CLINIC_OR_DEPARTMENT_OTHER): Payer: Medicare Other

## 2013-05-11 DIAGNOSIS — Z7982 Long term (current) use of aspirin: Secondary | ICD-10-CM | POA: Insufficient documentation

## 2013-05-11 DIAGNOSIS — Z87448 Personal history of other diseases of urinary system: Secondary | ICD-10-CM | POA: Insufficient documentation

## 2013-05-11 DIAGNOSIS — R42 Dizziness and giddiness: Secondary | ICD-10-CM | POA: Insufficient documentation

## 2013-05-11 DIAGNOSIS — Z8719 Personal history of other diseases of the digestive system: Secondary | ICD-10-CM | POA: Insufficient documentation

## 2013-05-11 DIAGNOSIS — Z79899 Other long term (current) drug therapy: Secondary | ICD-10-CM | POA: Insufficient documentation

## 2013-05-11 DIAGNOSIS — I10 Essential (primary) hypertension: Secondary | ICD-10-CM | POA: Insufficient documentation

## 2013-05-11 LAB — URINE MICROSCOPIC-ADD ON

## 2013-05-11 LAB — URINALYSIS, ROUTINE W REFLEX MICROSCOPIC
Bilirubin Urine: NEGATIVE
Glucose, UA: NEGATIVE mg/dL
Hgb urine dipstick: NEGATIVE
Ketones, ur: NEGATIVE mg/dL
NITRITE: NEGATIVE
PROTEIN: NEGATIVE mg/dL
Specific Gravity, Urine: 1.013 (ref 1.005–1.030)
UROBILINOGEN UA: 0.2 mg/dL (ref 0.0–1.0)
pH: 5.5 (ref 5.0–8.0)

## 2013-05-11 LAB — CBC WITH DIFFERENTIAL/PLATELET
BASOS ABS: 0 10*3/uL (ref 0.0–0.1)
Basophils Relative: 0 % (ref 0–1)
EOS ABS: 0.1 10*3/uL (ref 0.0–0.7)
EOS PCT: 3 % (ref 0–5)
HEMATOCRIT: 34.6 % — AB (ref 36.0–46.0)
Hemoglobin: 11.2 g/dL — ABNORMAL LOW (ref 12.0–15.0)
LYMPHS ABS: 1.9 10*3/uL (ref 0.7–4.0)
LYMPHS PCT: 42 % (ref 12–46)
MCH: 30.3 pg (ref 26.0–34.0)
MCHC: 32.4 g/dL (ref 30.0–36.0)
MCV: 93.5 fL (ref 78.0–100.0)
MONO ABS: 0.5 10*3/uL (ref 0.1–1.0)
Monocytes Relative: 10 % (ref 3–12)
Neutro Abs: 2 10*3/uL (ref 1.7–7.7)
Neutrophils Relative %: 44 % (ref 43–77)
Platelets: 219 10*3/uL (ref 150–400)
RBC: 3.7 MIL/uL — ABNORMAL LOW (ref 3.87–5.11)
RDW: 12.9 % (ref 11.5–15.5)
WBC: 4.5 10*3/uL (ref 4.0–10.5)

## 2013-05-11 LAB — COMPREHENSIVE METABOLIC PANEL
ALT: 24 U/L (ref 0–35)
AST: 32 U/L (ref 0–37)
Albumin: 3.6 g/dL (ref 3.5–5.2)
Alkaline Phosphatase: 74 U/L (ref 39–117)
BUN: 25 mg/dL — ABNORMAL HIGH (ref 6–23)
CALCIUM: 9.9 mg/dL (ref 8.4–10.5)
CO2: 27 meq/L (ref 19–32)
CREATININE: 1.8 mg/dL — AB (ref 0.50–1.10)
Chloride: 100 mEq/L (ref 96–112)
GFR calc non Af Amer: 24 mL/min — ABNORMAL LOW (ref >90)
GFR, EST AFRICAN AMERICAN: 28 mL/min — AB (ref >90)
GLUCOSE: 104 mg/dL — AB (ref 70–99)
Potassium: 4.3 mEq/L (ref 3.7–5.3)
Sodium: 140 mEq/L (ref 137–147)
Total Bilirubin: 0.6 mg/dL (ref 0.3–1.2)
Total Protein: 7.4 g/dL (ref 6.0–8.3)

## 2013-05-11 LAB — I-STAT CG4 LACTIC ACID, ED: Lactic Acid, Venous: 1.11 mmol/L (ref 0.5–2.2)

## 2013-05-11 LAB — PROTIME-INR
INR: 1.09 (ref 0.00–1.49)
PROTHROMBIN TIME: 13.9 s (ref 11.6–15.2)

## 2013-05-11 LAB — LIPASE, BLOOD: Lipase: 43 U/L (ref 11–59)

## 2013-05-11 NOTE — ED Notes (Signed)
C/o dizziness since 11am-denies pain -A/O

## 2013-05-11 NOTE — ED Notes (Signed)
MD at bedside. 

## 2013-05-11 NOTE — Discharge Instructions (Signed)
As discussed, your evaluation tonight has been largely reassuring.  However, with your dizziness is important that you followup with both your primary care physician and our neurology colleagues.  It is also very important to return here on Saturday for MRI to continue evaluation of your dizziness.  Be sure to return here immediately if you develop new, or concerning changes in your condition.   Dizziness Dizziness is a common problem. It is a feeling of unsteadiness or lightheadedness. You may feel like you are about to faint. Dizziness can lead to injury if you stumble or fall. A person of any age group can suffer from dizziness, but dizziness is more common in older adults. CAUSES  Dizziness can be caused by many different things, including:  Middle ear problems.  Standing for too long.  Infections.  An allergic reaction.  Aging.  An emotional response to something, such as the sight of blood.  Side effects of medicines.  Fatigue.  Problems with circulation or blood pressure.  Excess use of alcohol, medicines, or illegal drug use.  Breathing too fast (hyperventilation).  An arrhythmia or problems with your heart rhythm.  Low red blood cell count (anemia).  Pregnancy.  Vomiting, diarrhea, fever, or other illnesses that cause dehydration.  Diseases or conditions such as Parkinson's disease, high blood pressure (hypertension), diabetes, and thyroid problems.  Exposure to extreme heat. DIAGNOSIS  To find the cause of your dizziness, your caregiver may do a physical exam, lab tests, radiologic imaging scans, or an electrocardiography test (ECG).  TREATMENT  Treatment of dizziness depends on the cause of your symptoms and can vary greatly. HOME CARE INSTRUCTIONS   Drink enough fluids to keep your urine clear or pale yellow. This is especially important in very hot weather. In the elderly, it is also important in cold weather.  If your dizziness is caused by medicines,  take them exactly as directed. When taking blood pressure medicines, it is especially important to get up slowly.  Rise slowly from chairs and steady yourself until you feel okay.  In the morning, first sit up on the side of the bed. When this seems okay, stand slowly while holding onto something until you know your balance is fine.  If you need to stand in one place for a long time, be sure to move your legs often. Tighten and relax the muscles in your legs while standing.  If dizziness continues to be a problem, have someone stay with you for a day or two. Do this until you feel you are well enough to stay alone. Have the person call your caregiver if he or she notices changes in you that are concerning.  Do not drive or use heavy machinery if you feel dizzy.  Do not drink alcohol. SEEK IMMEDIATE MEDICAL CARE IF:   Your dizziness or lightheadedness gets worse.  You feel nauseous or vomit.  You develop problems with talking, walking, weakness, or using your arms, hands, or legs.  You are not thinking clearly or you have difficulty forming sentences. It may take a friend or family member to determine if your thinking is normal.  You develop chest pain, abdominal pain, shortness of breath, or sweating.  Your vision changes.  You notice any bleeding.  You have side effects from medicine that seems to be getting worse rather than better. MAKE SURE YOU:   Understand these instructions.  Will watch your condition.  Will get help right away if you are not doing well or  get worse. Document Released: 08/05/2000 Document Revised: 05/04/2011 Document Reviewed: 08/29/2010 [facility] Patient Information 2014 [place], [place].

## 2013-05-11 NOTE — ED Notes (Signed)
Pt instructed by Molly Maduroobert in radiology that MRI will call her tomorrow and give her an appt for Saturday to come back for an MRI.  Pt voiced understanding.

## 2013-05-11 NOTE — ED Provider Notes (Signed)
CSN: [id number]     Arrival date & time 05/11/13  1829 History  This chart was scribed for Gerhard Munch, MD by Ardelia Mems, ED Scribe. This patient was seen in room MH07/MH07 and the patient's care was started at 7:15 PM.    Chief Complaint  Patient presents with  . Dizziness    The history is provided by the patient. No language interpreter was used.    HPI Comments: Mia Garcia is a 80 y.o. Female with a history of HTN who presents to the Emergency Department complaining of intermittent lightheadedness onset about 8 hours ago at 11 AM when pt reports she was getting ready for a funeral. She states that her lightheadedness has been mildly relieved by lying supine. She states that she has a remote history of similar lightheadedness, but none recently. She states that she is not having any pain. She denies weight changes, dyspnea, nausea, vomiting, diarrhea, cough, fever, dysuria or any other recent symptoms. She states that she lives alone   Past Medical History  Diagnosis Date  . Hypertension   . Renal disorder   . GI bleed    Past Surgical History  Procedure Laterality Date  . Abdominal surgery    . Abdominal hysterectomy    . Appendectomy    . Ankle surgery     No family history on file. History  Substance Use Topics  . Smoking status: Never Smoker   . Smokeless tobacco: Not on file  . Alcohol Use: No   OB History   Grav Para Term Preterm Abortions TAB SAB Ect Mult Living                 Review of Systems  Constitutional:       Per HPI, otherwise negative  HENT:       Per HPI, otherwise negative  Respiratory:       Per HPI, otherwise negative  Cardiovascular:       Per HPI, otherwise negative  Gastrointestinal: Negative for vomiting.  Endocrine:       Negative aside from HPI  Genitourinary:       Neg aside from HPI   Musculoskeletal:       Per HPI, otherwise negative  Skin: Negative.   Neurological: Negative for syncope.    Allergies  Review of  patient's allergies indicates no known allergies.  Home Medications   Current Outpatient Rx  Name  Route  Sig  Dispense  Refill  . Cholecalciferol (VITAMIN D PO)   Oral   Take by mouth.         . latanoprost (XALATAN) 0.005 % ophthalmic solution      1 drop at bedtime.         . metoprolol succinate (TOPROL-XL) 50 MG 24 hr tablet   Oral   Take 50 mg by mouth daily. Take with or immediately following a meal.         . aluminum & magnesium hydroxide-simethicone (MYLANTA) 500-450-40 MG/5ML suspension   Oral   Take 5 mLs by mouth every 6 (six) hours as needed. For heartburn.         Marland Kitchen aspirin EC 81 MG tablet   Oral   Take 81 mg by mouth daily.         . lansoprazole (PREVACID) 15 MG capsule   Oral   Take 15 mg by mouth daily. For acid reflux.         . Multiple Vitamin (MULITIVITAMIN WITH MINERALS) TABS  Oral   Take 1 tablet by mouth daily.         . pravastatin (PRAVACHOL) 40 MG tablet   Oral   Take 40 mg by mouth daily.          Triage Vitals: BP 143/76  Pulse 81  Temp(Src) 98.4 F (36.9 C) (Oral)  Resp 18  Ht 5\' 2"  (1.575 m)  Wt 160 lb (72.576 kg)  BMI 29.26 kg/m2  SpO2 100%  Physical Exam  Nursing note and vitals reviewed. Constitutional: She is oriented to person, place, and time. She appears well-developed and well-nourished. No distress.  HENT:  Head: Normocephalic and atraumatic.  Eyes: Conjunctivae and EOM are normal.  Cardiovascular: Normal rate, regular rhythm and intact distal pulses.   DP pulses are normal  Pulmonary/Chest: Effort normal and breath sounds normal. No stridor. No respiratory distress.  Abdominal: She exhibits no distension.  Musculoskeletal: She exhibits no edema.  Neurological: She is alert and oriented to person, place, and time. She has normal reflexes. She displays normal reflexes. No cranial nerve deficit. She exhibits normal muscle tone. Coordination normal.  5/5 and equal strength in upper extremities. No  facial asymmetry. No facial droop. Speech is clear  Skin: Skin is warm and dry.  Psychiatric: She has a normal mood and affect.    ED Course  Procedures (including critical care time)  DIAGNOSTIC STUDIES: Oxygen Saturation is 100% on RA, normal by my interpretation.    COORDINATION OF CARE: 7:20 PM- Discussed plan to obtain diagnostic lab work and radiology. Will also medications. Pt advised of plan for treatment and pt agrees.  Labs Review Labs Reviewed  CBC WITH DIFFERENTIAL - Abnormal; Notable for the following:    RBC 3.70 (*)    Hemoglobin 11.2 (*)    HCT 34.6 (*)    All other components within normal limits  COMPREHENSIVE METABOLIC PANEL - Abnormal; Notable for the following:    Glucose, Bld 104 (*)    BUN 25 (*)    Creatinine, Ser 1.80 (*)    GFR calc non Af Amer 24 (*)    GFR calc Af Amer 28 (*)    All other components within normal limits  LIPASE, BLOOD  PROTIME-INR  URINALYSIS, ROUTINE W REFLEX MICROSCOPIC  I-STAT CG4 LACTIC ACID, ED   Imaging Review Dg Chest 2 View  05/11/2013   CLINICAL DATA:  High blood pressure, dizziness  EXAM: CHEST  2 VIEW  COMPARISON:  01/06/2013  FINDINGS: Minimal enlargement of cardiac silhouette.  Minimally prominent right peritracheal soft tissues unchanged.  No mass effect upon trachea.  Mediastinal contours and pulmonary vascularity normal.  Lungs emphysematous but clear.  No pleural effusion or pneumothorax.  Scattered endplate spur formation thoracic spine.  IMPRESSION: Emphysematous changes.  Minimal enlargement of cardiac silhouette.  No acute abnormalities.   Electronically Signed   By: Ulyses SouthwardMark  Boles M.D.   On: 05/11/2013 20:58   Ct Head Wo Contrast  05/11/2013   CLINICAL DATA:  Dizziness.  EXAM: CT HEAD WITHOUT CONTRAST  TECHNIQUE: Contiguous axial images were obtained from the base of the skull through the vertex without contrast.  COMPARISON:  10/07/1998  FINDINGS: No evidence for acute hemorrhage, mass lesion, midline shift,  hydrocephalus or large infarct. There is mild cerebral atrophy which is likely age appropriate. Subtle low-density in the periventricular and subcortical white matter. There is fluid in the left mastoid air cells. No acute bone abnormality. The visualized paranasal sinuses are clear. Old fracture to the left  medial orbital wall.  IMPRESSION: No acute intracranial abnormality.  Fluid in left mastoid air cells.   Electronically Signed   By: Richarda Overlie M.D.   On: 05/11/2013 20:56     EKG Interpretation   Date/Time:  Thursday May 11 2013 19:45:57 EDT Ventricular Rate:  67 PR Interval:  150 QRS Duration: 76 QT Interval:  408 QTC Calculation: 431 R Axis:   42 Text Interpretation:  Normal sinus rhythm Normal ECG Sinus rhythm Normal  ECG Confirmed by Gerhard Munch  MD ([id number]) on 05/11/2013 8:54:47 PM     9:21 PM On exam the patient appears comfortable.  She has no new complaints.  She states that she feels markedly better.  She voices understanding of return precautions, follow up instructions.  Her colleague does the same. MDM   Final diagnoses:  Dizziness    I personally performed the services described in this documentation, which was scribed in my presence. The recorded information has been reviewed and is accurate.  This elderly female presents with concern.  On exam she is awake, alert, hemodynamically stable, neurologically intact.  Patient is in no distress.  Patient's evaluation is largely reassuring.  Patient endorses a history of prior similar episodes.  Patient's labs demonstrate mild renal dysfunction, which may be contributory.  Absent any distress, and was reassuring labs, imaging, patient was discharged in stable condition to continue evaluation as an outpatient.  Patient and her colleague who is understanding of instructions, precautions.    Gerhard Munch, MD 05/11/13 2122

## 2013-05-11 NOTE — ED Notes (Signed)
Patient transported to X-ray 

## 2013-05-13 ENCOUNTER — Ambulatory Visit (HOSPITAL_BASED_OUTPATIENT_CLINIC_OR_DEPARTMENT_OTHER)
Admission: RE | Admit: 2013-05-13 | Discharge: 2013-05-13 | Disposition: A | Discharge status: Home / Self Care | Payer: Medicare Other | Source: Ambulatory Visit | Attending: Emergency Medicine | Admitting: Emergency Medicine

## 2013-05-13 DIAGNOSIS — R42 Dizziness and giddiness: Secondary | ICD-10-CM | POA: Insufficient documentation

## 2013-05-16 ENCOUNTER — Encounter: Payer: Self-pay | Admitting: Neurology

## 2013-05-16 ENCOUNTER — Ambulatory Visit: Payer: Medicare Other | Admitting: Neurology

## 2013-05-16 ENCOUNTER — Ambulatory Visit (INDEPENDENT_AMBULATORY_CARE_PROVIDER_SITE_OTHER): Payer: Medicare Other | Admitting: Neurology

## 2013-05-16 VITALS — BP 136/77 | HR 87 | Ht 60.0 in | Wt 162.0 lb

## 2013-05-16 DIAGNOSIS — R42 Dizziness and giddiness: Secondary | ICD-10-CM

## 2013-05-16 DIAGNOSIS — R269 Unspecified abnormalities of gait and mobility: Secondary | ICD-10-CM | POA: Insufficient documentation

## 2013-05-16 NOTE — Patient Instructions (Signed)
I had a long discussion the patient and her friend regarding her dizziness and gait imbalance difficulties, discuss results of my evaluation, available imaging studies and answered questions. I recommend checking MRI scan of the cervical, thoracic and lumbar spine to evaluate for compressive myelopathy as well as referral to physical therapy for gait and balance training. We also talked about fall risk precautions. Return for followup in 2 months or call earlier if necessary.  Fall Prevention and Home Safety Falls cause injuries and can affect all age groups. It is possible to use preventive measures to significantly decrease the likelihood of falls. There are many simple measures which can make your home safer and prevent falls. OUTDOORS  Repair cracks and edges of walkways and driveways.  Remove high doorway thresholds.  Trim shrubbery on the main path into your home.  Have good outside lighting.  Clear walkways of tools, rocks, debris, and clutter.  Check that handrails are not broken and are securely fastened. Both sides of steps should have handrails.  Have leaves, snow, and ice cleared regularly.  Use sand or salt on walkways during winter months.  In the garage, clean up grease or oil spills. BATHROOM  Install night lights.  Install grab bars by the toilet and in the tub and shower.  Use non-skid mats or decals in the tub or shower.  Place a plastic non-slip stool in the shower to sit on, if needed.  Keep floors dry and clean up all water on the floor immediately.  Remove soap buildup in the tub or shower on a regular basis.  Secure bath mats with non-slip, double-sided rug tape.  Remove throw rugs and tripping hazards from the floors. BEDROOMS  Install night lights.  Make sure a bedside light is easy to reach.  Do not use oversized bedding.  Keep a telephone by your bedside.  Have a firm chair with side arms to use for getting dressed.  Remove throw rugs  and tripping hazards from the floor. KITCHEN  Keep handles on pots and pans turned toward the center of the stove. Use back burners when possible.  Clean up spills quickly and allow time for drying.  Avoid walking on wet floors.  Avoid hot utensils and knives.  Position shelves so they are not too high or low.  Place commonly used objects within easy reach.  If necessary, use a sturdy step stool with a grab bar when reaching.  Keep electrical cables out of the way.  Do not use floor polish or wax that makes floors slippery. If you must use wax, use non-skid floor wax.  Remove throw rugs and tripping hazards from the floor. STAIRWAYS  Never leave objects on stairs.  Place handrails on both sides of stairways and use them. Fix any loose handrails. Make sure handrails on both sides of the stairways are as long as the stairs.  Check carpeting to make sure it is firmly attached along stairs. Make repairs to worn or loose carpet promptly.  Avoid placing throw rugs at the top or bottom of stairways, or properly secure the rug with carpet tape to prevent slippage. Get rid of throw rugs, if possible.  Have an electrician put in a light switch at the top and bottom of the stairs. OTHER FALL PREVENTION TIPS  Wear low-heel or rubber-soled shoes that are supportive and fit well. Wear closed toe shoes.  When using a stepladder, make sure it is fully opened and both spreaders are firmly locked. Do not  climb a closed stepladder.  Add color or contrast paint or tape to grab bars and handrails in your home. Place contrasting color strips on first and last steps.  Learn and use mobility aids as needed. Install an electrical emergency response system.  Turn on lights to avoid dark areas. Replace light bulbs that burn out immediately. Get light switches that glow.  Arrange furniture to create clear pathways. Keep furniture in the same place.  Firmly attach carpet with non-skid or  double-sided tape.  Eliminate uneven floor surfaces.  Select a carpet pattern that does not visually hide the edge of steps.  Be aware of all pets. OTHER HOME SAFETY TIPS  Set the water temperature for 120 F (48.8 C).  Keep emergency numbers on or near the telephone.  Keep smoke detectors on every level of the home and near sleeping areas. Document Released: 01/30/2002 Document Revised: 08/11/2011 Document Reviewed: 05/01/2011 [facility] Patient Information 2014 [employer], [place].

## 2013-05-16 NOTE — Progress Notes (Signed)
[facility] [address] [place]. [place] [address] [phone]       OFFICE CONSULT NOTE  Mia. Mia Garcia Date of Birth:  06/10/1922 Medical Record Number:  [id number]   Referring MD:  Gerhard Munch  Reason for Referral:  dizziness  HPI: Mia Garcia is a 46 year african american lady who developed sudden onset of dizziness on 05/11/13. She describes a feeling of off balance this happened while she was changing her clothes. The film was intense and she had to lie down most of the day. She is unable to walk without holding onto something. She denied vertigo, nausea, blurred vision or focal extremity weakness or numbness. She was in the emergency room a CT scan of the head  Which I personally reviewed was unremarkable and basic labs were negative. She felt better and was discharged home. And she feels that her dizziness has improved somewhat but she occasionally feels dizzy and off balance when she changes position. She denies any medication change in recent months. She denies any ringing in the ears, hearing loss, prior history of vertigo. There is no prior history of TIAs strokes or significant neurological problems. She denies any burning in the feet, tingling numbness-like pain. She has no history of back pain or radicular pain. Her blood pressure is documented today in the office Her blood pressure today in  laying down position was 154/80, sitting position 144/88 and standing position 136/77 and she did not complain of subjective dizziness on standing.  ROS:   14 system review of systems is positive for leg swelling, hearing loss, ringing in the ears, blurred willing to vision, joint pain, headache, numbness, dizziness, appetite change and all the systems negative  PMH:  Past Medical History  Diagnosis Date  . Hypertension   . Renal disorder   . GI bleed     Social History:  History   Social History  . Marital Status: Widowed    Spouse Name: N/A    Number  of Children: 0  . Years of Education: 9th   Occupational History  . retired    Social History Main Topics  . Smoking status: Never Smoker   . Smokeless tobacco: Not on file  . Alcohol Use: No  . Drug Use: No  . Sexual Activity: Not on file   Other Topics Concern  . Not on file   Social History Narrative   Patient lives at home alone.    Medications:   Current Outpatient Prescriptions on File Prior to Visit  Medication Sig Dispense Refill  . aluminum & magnesium hydroxide-simethicone (MYLANTA) 500-450-40 MG/5ML suspension Take 5 mLs by mouth every 6 (six) hours as needed. For heartburn.      Marland Kitchen aspirin EC 81 MG tablet Take 81 mg by mouth daily.      . Cholecalciferol (VITAMIN D PO) Take by mouth.      . lansoprazole (PREVACID) 15 MG capsule Take 15 mg by mouth daily. For acid reflux.      . metoprolol succinate (TOPROL-XL) 50 MG 24 hr tablet Take 50 mg by mouth daily. Take with or immediately following a meal.      . Multiple Vitamin (MULITIVITAMIN WITH MINERALS) TABS Take 1 tablet by mouth daily.      . pravastatin (PRAVACHOL) 40 MG tablet Take 40 mg by mouth daily.       No current facility-administered medications on file prior to visit.    Allergies:   Allergies  Allergen Reactions  .  Gabapentin     Physical Exam General: well developed, well nourished, seated, in no evident distress Head: head normocephalic and atraumatic. Orohparynx benign Neck: supple with no carotid or supraclavicular bruits Cardiovascular: regular rate and rhythm, no murmurs Musculoskeletal: no deformity Skin:  no rash/petichiae. Old scar left cheek Vascular:  Normal pulses all extremities Filed Vitals:   05/16/13 1321  BP: 136/77  Pulse: 87    Neurologic Exam Mental Status: Awake and fully alert. Oriented to place and time. Recent and remote memory intact. Attention span, concentration and fund of knowledge appropriate. Mood and affect appropriate.  Cranial Nerves: Fundoscopic exam  reveals sharp disc margins. Pupils equal, briskly reactive to light. Extraocular movements full without nystagmus. Visual fields full to confrontation. Hearing mildly diminished left ear. Facial sensation intact. Face, tongue, palate moves normally and symmetrically.  Motor: Normal bulk and tone. Normal strength in all tested extremity muscles. Sensory.: intact to tough and pinprick and vibratory.  Coordination: Rapid alternating movements normal in all extremities. Finger-to-nose and heel-to-shin performed accurately bilaterally. Head shaking no nystagmus or vertigo Gait and Station: Arises from chair without difficulty. Stance is normal. Gait demonstrates normal stride length and balance . Not able to heel, toe and tandem walk without difficulty.  Reflexes: 2+ and asymmetric left more than right, upper extremity more than lower extremity. Toes downgoing.     ASSESSMENT: [age] year lady with dizziness and gait difficulties of unclear etiology. Neurological exam is unremarkable except for mild brisk asymmetric  hyperreflexia raising possibility of compressive myelopathy.    PLAN: I had a long discussion the patient and her friend regarding her dizziness and gait imbalance difficulties, discuss results of my evaluation, available imaging studies and answered questions. I recommend checking MRI scan of the cervical, thoracic and lumbar spine to evaluate for compressive myelopathy as well as referral to physical therapy for gait and balance training. We also talked about fall risk precautions. Return for followup in 2 months or call earlier if necessary.    Note: This document was prepared with digital dictation and possible smart phrase technology. Any transcriptional errors that result from this process are unintentional.

## 2013-06-01 ENCOUNTER — Other Ambulatory Visit: Payer: Medicare Other

## 2013-08-01 ENCOUNTER — Telehealth: Payer: Self-pay | Admitting: Neurology

## 2013-08-01 ENCOUNTER — Encounter: Payer: Self-pay | Admitting: Neurology

## 2013-08-01 NOTE — Telephone Encounter (Signed)
Spoke to patient regarding rescheduling 08/17/13 appointment per Dr. Marlis Edelson schedule, verbalized understanding, printed and mailed letter with new appointment time.

## 2013-08-17 ENCOUNTER — Ambulatory Visit: Payer: Medicare Other | Admitting: Neurology

## 2013-10-25 ENCOUNTER — Ambulatory Visit: Payer: Medicare Other | Admitting: Neurology

## 2014-02-12 ENCOUNTER — Other Ambulatory Visit (HOSPITAL_BASED_OUTPATIENT_CLINIC_OR_DEPARTMENT_OTHER): Payer: Self-pay | Admitting: Internal Medicine

## 2014-02-12 DIAGNOSIS — Z1231 Encounter for screening mammogram for malignant neoplasm of breast: Secondary | ICD-10-CM

## 2014-03-13 ENCOUNTER — Ambulatory Visit (HOSPITAL_BASED_OUTPATIENT_CLINIC_OR_DEPARTMENT_OTHER)
Admission: RE | Admit: 2014-03-13 | Discharge: 2014-03-13 | Disposition: A | Discharge status: Home / Self Care | Payer: Medicare Other | Source: Ambulatory Visit | Attending: Internal Medicine | Admitting: Internal Medicine

## 2014-03-13 ENCOUNTER — Ambulatory Visit (HOSPITAL_BASED_OUTPATIENT_CLINIC_OR_DEPARTMENT_OTHER): Payer: Medicare Other

## 2014-03-13 DIAGNOSIS — Z1231 Encounter for screening mammogram for malignant neoplasm of breast: Secondary | ICD-10-CM | POA: Insufficient documentation

## 2014-07-06 ENCOUNTER — Emergency Department (HOSPITAL_BASED_OUTPATIENT_CLINIC_OR_DEPARTMENT_OTHER): Payer: Medicare Other

## 2014-07-06 ENCOUNTER — Encounter (HOSPITAL_BASED_OUTPATIENT_CLINIC_OR_DEPARTMENT_OTHER): Payer: Self-pay | Admitting: *Deleted

## 2014-07-06 ENCOUNTER — Emergency Department (HOSPITAL_BASED_OUTPATIENT_CLINIC_OR_DEPARTMENT_OTHER)
Admission: EM | Admit: 2014-07-06 | Discharge: 2014-07-06 | Disposition: A | Discharge status: Home / Self Care | Payer: Medicare Other | Attending: Emergency Medicine | Admitting: Emergency Medicine

## 2014-07-06 DIAGNOSIS — Z79899 Other long term (current) drug therapy: Secondary | ICD-10-CM | POA: Diagnosis not present

## 2014-07-06 DIAGNOSIS — Z7982 Long term (current) use of aspirin: Secondary | ICD-10-CM | POA: Insufficient documentation

## 2014-07-06 DIAGNOSIS — Z8719 Personal history of other diseases of the digestive system: Secondary | ICD-10-CM | POA: Insufficient documentation

## 2014-07-06 DIAGNOSIS — R519 Headache, unspecified: Secondary | ICD-10-CM

## 2014-07-06 DIAGNOSIS — R51 Headache: Secondary | ICD-10-CM | POA: Insufficient documentation

## 2014-07-06 DIAGNOSIS — I10 Essential (primary) hypertension: Secondary | ICD-10-CM | POA: Insufficient documentation

## 2014-07-06 DIAGNOSIS — Z87448 Personal history of other diseases of urinary system: Secondary | ICD-10-CM | POA: Diagnosis not present

## 2014-07-06 LAB — COMPREHENSIVE METABOLIC PANEL
ALBUMIN: 3.6 g/dL (ref 3.5–5.0)
ALT: 20 U/L (ref 14–54)
ANION GAP: 8 (ref 5–15)
AST: 30 U/L (ref 15–41)
Alkaline Phosphatase: 77 U/L (ref 38–126)
BILIRUBIN TOTAL: 0.8 mg/dL (ref 0.3–1.2)
BUN: 14 mg/dL (ref 6–20)
CALCIUM: 9.3 mg/dL (ref 8.9–10.3)
CO2: 26 mmol/L (ref 22–32)
CREATININE: 1.58 mg/dL — AB (ref 0.44–1.00)
Chloride: 107 mmol/L (ref 101–111)
GFR, EST AFRICAN AMERICAN: 32 mL/min — AB (ref >60)
GFR, EST NON AFRICAN AMERICAN: 28 mL/min — AB (ref >60)
GLUCOSE: 119 mg/dL — AB (ref 65–99)
POTASSIUM: 4 mmol/L (ref 3.5–5.1)
Sodium: 141 mmol/L (ref 135–145)
Total Protein: 6.9 g/dL (ref 6.5–8.1)

## 2014-07-06 LAB — URINALYSIS, ROUTINE W REFLEX MICROSCOPIC
Bilirubin Urine: NEGATIVE
GLUCOSE, UA: NEGATIVE mg/dL
HGB URINE DIPSTICK: NEGATIVE
Ketones, ur: NEGATIVE mg/dL
NITRITE: NEGATIVE
PROTEIN: NEGATIVE mg/dL
Specific Gravity, Urine: 1.022 (ref 1.005–1.030)
Urobilinogen, UA: 1 mg/dL (ref 0.0–1.0)
pH: 5.5 (ref 5.0–8.0)

## 2014-07-06 LAB — CBC WITH DIFFERENTIAL/PLATELET
BASOS ABS: 0 10*3/uL (ref 0.0–0.1)
Basophils Relative: 1 % (ref 0–1)
EOS ABS: 0.1 10*3/uL (ref 0.0–0.7)
EOS PCT: 3 % (ref 0–5)
HEMATOCRIT: 36 % (ref 36.0–46.0)
Hemoglobin: 11.6 g/dL — ABNORMAL LOW (ref 12.0–15.0)
LYMPHS PCT: 39 % (ref 12–46)
Lymphs Abs: 1.6 10*3/uL (ref 0.7–4.0)
MCH: 29.8 pg (ref 26.0–34.0)
MCHC: 32.2 g/dL (ref 30.0–36.0)
MCV: 92.5 fL (ref 78.0–100.0)
MONOS PCT: 8 % (ref 3–12)
Monocytes Absolute: 0.3 10*3/uL (ref 0.1–1.0)
Neutro Abs: 1.9 10*3/uL (ref 1.7–7.7)
Neutrophils Relative %: 49 % (ref 43–77)
Platelets: 156 10*3/uL (ref 150–400)
RBC: 3.89 MIL/uL (ref 3.87–5.11)
RDW: 13.4 % (ref 11.5–15.5)
WBC: 4 10*3/uL (ref 4.0–10.5)

## 2014-07-06 LAB — URINE MICROSCOPIC-ADD ON

## 2014-07-06 NOTE — ED Notes (Signed)
Patient transported to CT 

## 2014-07-06 NOTE — ED Notes (Signed)
Pt c/o h/a , dizziness and nausea x 6 days

## 2014-07-06 NOTE — Discharge Instructions (Signed)

## 2014-07-11 NOTE — ED Provider Notes (Signed)
CSN: [id number]     Arrival date & time 07/06/14  1334 History   First MD Initiated Contact with Patient 07/06/14 1505     Chief Complaint  Patient presents with  . Headache     (Consider location/radiation/quality/duration/timing/severity/associated sxs/prior Treatment) HPI   81 year old female with headaches. Intermittent for the past 6 days. Gradual onset. She cannot remember what she was doing which he first noticed it. No appreciable exacerbating relieving factors. Pain is diffuse and throbbing. No neck pain or neck stiffness. No acute visual complaints. Associated nausea. No vomiting. No blood thinners.   Past Medical History  Diagnosis Date  . Hypertension   . Renal disorder   . GI bleed    Past Surgical History  Procedure Laterality Date  . Abdominal surgery    . Abdominal hysterectomy    . Appendectomy    . Ankle surgery     Family History  Problem Relation Age of Onset  . Family history unknown: Yes   History  Substance Use Topics  . Smoking status: Never Smoker   . Smokeless tobacco: Not on file  . Alcohol Use: No   OB History    No data available     Review of Systems  All systems reviewed and negative, other than as noted in HPI.   Allergies  Gabapentin  Home Medications   Prior to Admission medications   Medication Sig Start Date End Date Taking? Authorizing Provider  aluminum & magnesium hydroxide-simethicone (MYLANTA) 500-450-40 MG/5ML suspension Take 5 mLs by mouth every 6 (six) hours as needed. For heartburn.    Historical Provider, MD  aspirin EC 81 MG tablet Take 81 mg by mouth daily.    Historical Provider, MD  Cholecalciferol (VITAMIN D PO) Take by mouth.    Historical Provider, MD  lansoprazole (PREVACID) 15 MG capsule Take 15 mg by mouth daily. For acid reflux.    Historical Provider, MD  metoprolol succinate (TOPROL-XL) 50 MG 24 hr tablet Take 50 mg by mouth daily. Take with or immediately following a meal.    Historical Provider, MD   Multiple Vitamin (MULITIVITAMIN WITH MINERALS) TABS Take 1 tablet by mouth daily.    Historical Provider, MD  pravastatin (PRAVACHOL) 40 MG tablet Take 40 mg by mouth daily.    Historical Provider, MD   BP 148/52 mmHg  Pulse 62  Temp(Src) 98.2 F (36.8 C) (Oral)  Resp 16  Ht 5\' 1"  (1.549 m)  Wt 162 lb (73.483 kg)  BMI 30.63 kg/m2  SpO2 99% Physical Exam  Constitutional: She appears well-developed and well-nourished. No distress.  HENT:  Head: Normocephalic and atraumatic.  Eyes: Conjunctivae are normal. Right eye exhibits no discharge. Left eye exhibits no discharge.  Neck: Neck supple.  No nuchal rigidty  Cardiovascular: Normal rate, regular rhythm and normal heart sounds.  Exam reveals no gallop and no friction rub.   No murmur heard. Pulmonary/Chest: Effort normal and breath sounds normal. No respiratory distress.  Abdominal: Soft. She exhibits no distension. There is no tenderness.  Musculoskeletal: She exhibits no edema or tenderness.  Neurological: She is alert. No cranial nerve deficit. She exhibits normal muscle tone. Coordination normal.  Speech clear. Content appropriate. Good finger to nose b/l.   Skin: Skin is warm and dry.  Psychiatric: She has a normal mood and affect. Her behavior is normal. Thought content normal.  Nursing note and vitals reviewed.   ED Course  Procedures (including critical care time) Labs Review Labs Reviewed  CBC WITH  DIFFERENTIAL/PLATELET - Abnormal; Notable for the following:    Hemoglobin 11.6 (*)    All other components within normal limits  COMPREHENSIVE METABOLIC PANEL - Abnormal; Notable for the following:    Glucose, Bld 119 (*)    Creatinine, Ser 1.58 (*)    GFR calc non Af Amer 28 (*)    GFR calc Af Amer 32 (*)    All other components within normal limits  URINALYSIS, ROUTINE W REFLEX MICROSCOPIC - Abnormal; Notable for the following:    Leukocytes, UA MODERATE (*)    All other components within normal limits  URINE  MICROSCOPIC-ADD ON    Imaging Review No results found.   EKG Interpretation   Date/Time:  Friday Jul 06 2014 14:23:04 EDT Ventricular Rate:  58 PR Interval:  128 QRS Duration: 86 QT Interval:  408 QTC Calculation: 400 R Axis:   61 Text Interpretation:  Sinus bradycardia Otherwise normal ECG ED PHYSICIAN  INTERPRETATION AVAILABLE IN [facility] Confirmed by TEST, Record  ([id number]) on 07/07/2014 6:53:19 AM      MDM   Final diagnoses:  Nonintractable headache, unspecified chronicity pattern, unspecified headache type    89yF with headache. Suspect primary HA. Consider emergent secondary causes such as bleed, infectious or mass but doubt. There is no history of trauma. Pt has a nonfocal neurological exam. Afebrile and neck supple. No use of blood thinning medication. Consider ocular etiology such as acute angle closure glaucoma but doubt. Pt denies acute change in visual acuity and eye exam unremarkable. Doubt temporal arteritis. No temporal tenderness, symptoms of jaw claudication and temporal artery pulsations palpable. Doubt CO poisoning. No contacts with similar symptoms. Doubt venous thrombosis. Doubt carotid or vertebral arteries dissection. . Feel that can be safely discharged, but strict return precautions discussed. Outpt fu.     Raeford RazorStephen Jeriyah Granlund, MD 07/11/14 [id number]

## 2015-02-05 ENCOUNTER — Other Ambulatory Visit (HOSPITAL_BASED_OUTPATIENT_CLINIC_OR_DEPARTMENT_OTHER): Payer: Self-pay | Admitting: Internal Medicine

## 2015-02-05 DIAGNOSIS — Z1231 Encounter for screening mammogram for malignant neoplasm of breast: Secondary | ICD-10-CM

## 2015-03-19 ENCOUNTER — Ambulatory Visit (HOSPITAL_BASED_OUTPATIENT_CLINIC_OR_DEPARTMENT_OTHER)
Admission: RE | Admit: 2015-03-19 | Discharge: 2015-03-19 | Disposition: A | Discharge status: Home / Self Care | Payer: Medicare Other | Source: Ambulatory Visit | Attending: Internal Medicine | Admitting: Internal Medicine

## 2015-03-19 DIAGNOSIS — Z1231 Encounter for screening mammogram for malignant neoplasm of breast: Secondary | ICD-10-CM | POA: Insufficient documentation

## 2016-02-26 ENCOUNTER — Other Ambulatory Visit (HOSPITAL_BASED_OUTPATIENT_CLINIC_OR_DEPARTMENT_OTHER): Payer: Self-pay | Admitting: Internal Medicine

## 2016-02-26 DIAGNOSIS — Z1231 Encounter for screening mammogram for malignant neoplasm of breast: Secondary | ICD-10-CM

## 2016-03-24 ENCOUNTER — Ambulatory Visit (HOSPITAL_BASED_OUTPATIENT_CLINIC_OR_DEPARTMENT_OTHER): Payer: Medicare Other

## 2016-03-31 ENCOUNTER — Encounter (HOSPITAL_BASED_OUTPATIENT_CLINIC_OR_DEPARTMENT_OTHER): Payer: Self-pay

## 2016-03-31 ENCOUNTER — Ambulatory Visit (HOSPITAL_BASED_OUTPATIENT_CLINIC_OR_DEPARTMENT_OTHER)
Admission: RE | Admit: 2016-03-31 | Discharge: 2016-03-31 | Disposition: A | Discharge status: Home / Self Care | Payer: Medicare Other | Source: Ambulatory Visit | Attending: Internal Medicine | Admitting: Internal Medicine

## 2016-03-31 DIAGNOSIS — Z1231 Encounter for screening mammogram for malignant neoplasm of breast: Secondary | ICD-10-CM | POA: Insufficient documentation

## 2017-02-25 ENCOUNTER — Other Ambulatory Visit (HOSPITAL_BASED_OUTPATIENT_CLINIC_OR_DEPARTMENT_OTHER): Payer: Self-pay | Admitting: Internal Medicine

## 2017-02-25 DIAGNOSIS — Z1231 Encounter for screening mammogram for malignant neoplasm of breast: Secondary | ICD-10-CM

## 2017-03-31 ENCOUNTER — Ambulatory Visit (HOSPITAL_BASED_OUTPATIENT_CLINIC_OR_DEPARTMENT_OTHER): Payer: Medicare Other

## 2017-04-03 ENCOUNTER — Ambulatory Visit (HOSPITAL_BASED_OUTPATIENT_CLINIC_OR_DEPARTMENT_OTHER)
Admission: RE | Admit: 2017-04-03 | Discharge: 2017-04-03 | Disposition: A | Discharge status: Home / Self Care | Payer: Medicare Other | Source: Ambulatory Visit | Attending: Internal Medicine | Admitting: Internal Medicine

## 2017-04-03 DIAGNOSIS — R928 Other abnormal and inconclusive findings on diagnostic imaging of breast: Secondary | ICD-10-CM | POA: Diagnosis not present

## 2017-04-03 DIAGNOSIS — Z1231 Encounter for screening mammogram for malignant neoplasm of breast: Secondary | ICD-10-CM | POA: Diagnosis present

## 2017-04-06 ENCOUNTER — Other Ambulatory Visit: Payer: Self-pay | Admitting: Internal Medicine

## 2017-04-06 DIAGNOSIS — R928 Other abnormal and inconclusive findings on diagnostic imaging of breast: Secondary | ICD-10-CM

## 2017-04-12 ENCOUNTER — Ambulatory Visit
Admission: RE | Admit: 2017-04-12 | Discharge: 2017-04-12 | Disposition: A | Discharge status: Home / Self Care | Payer: Medicare Other | Source: Ambulatory Visit | Attending: Internal Medicine | Admitting: Internal Medicine

## 2017-04-12 DIAGNOSIS — R928 Other abnormal and inconclusive findings on diagnostic imaging of breast: Secondary | ICD-10-CM

## 2018-02-24 ENCOUNTER — Other Ambulatory Visit: Payer: Self-pay | Admitting: Internal Medicine

## 2018-02-24 DIAGNOSIS — Z1231 Encounter for screening mammogram for malignant neoplasm of breast: Secondary | ICD-10-CM

## 2018-02-26 ENCOUNTER — Emergency Department (HOSPITAL_BASED_OUTPATIENT_CLINIC_OR_DEPARTMENT_OTHER)
Admission: EM | Admit: 2018-02-26 | Discharge: 2018-02-26 | Disposition: A | Discharge status: Home / Self Care | Payer: Medicare Other | Attending: Emergency Medicine | Admitting: Emergency Medicine

## 2018-02-26 ENCOUNTER — Other Ambulatory Visit: Payer: Self-pay

## 2018-02-26 ENCOUNTER — Encounter (HOSPITAL_BASED_OUTPATIENT_CLINIC_OR_DEPARTMENT_OTHER): Payer: Self-pay | Admitting: *Deleted

## 2018-02-26 ENCOUNTER — Emergency Department (HOSPITAL_BASED_OUTPATIENT_CLINIC_OR_DEPARTMENT_OTHER): Payer: Medicare Other

## 2018-02-26 DIAGNOSIS — I10 Essential (primary) hypertension: Secondary | ICD-10-CM | POA: Insufficient documentation

## 2018-02-26 DIAGNOSIS — Z79899 Other long term (current) drug therapy: Secondary | ICD-10-CM | POA: Insufficient documentation

## 2018-02-26 DIAGNOSIS — R0789 Other chest pain: Secondary | ICD-10-CM | POA: Diagnosis present

## 2018-02-26 LAB — HEPATIC FUNCTION PANEL
ALT: 12 U/L (ref 0–44)
AST: 23 U/L (ref 15–41)
Albumin: 3.7 g/dL (ref 3.5–5.0)
Alkaline Phosphatase: 67 U/L (ref 38–126)
BILIRUBIN DIRECT: 0.1 mg/dL (ref 0.0–0.2)
Indirect Bilirubin: 0.6 mg/dL (ref 0.3–0.9)
Total Bilirubin: 0.7 mg/dL (ref 0.3–1.2)
Total Protein: 6.8 g/dL (ref 6.5–8.1)

## 2018-02-26 LAB — CBC
HEMATOCRIT: 35.9 % — AB (ref 36.0–46.0)
Hemoglobin: 10.8 g/dL — ABNORMAL LOW (ref 12.0–15.0)
MCH: 29 pg (ref 26.0–34.0)
MCHC: 30.1 g/dL (ref 30.0–36.0)
MCV: 96.5 fL (ref 80.0–100.0)
Platelets: 163 10*3/uL (ref 150–400)
RBC: 3.72 MIL/uL — ABNORMAL LOW (ref 3.87–5.11)
RDW: 12.8 % (ref 11.5–15.5)
WBC: 4.4 10*3/uL (ref 4.0–10.5)
nRBC: 0 % (ref 0.0–0.2)

## 2018-02-26 LAB — BASIC METABOLIC PANEL
Anion gap: 5 (ref 5–15)
BUN: 18 mg/dL (ref 8–23)
CALCIUM: 9.3 mg/dL (ref 8.9–10.3)
CHLORIDE: 105 mmol/L (ref 98–111)
CO2: 28 mmol/L (ref 22–32)
Creatinine, Ser: 1.43 mg/dL — ABNORMAL HIGH (ref 0.44–1.00)
GFR calc Af Amer: 36 mL/min — ABNORMAL LOW (ref >60)
GFR calc non Af Amer: 31 mL/min — ABNORMAL LOW (ref >60)
Glucose, Bld: 98 mg/dL (ref 70–99)
Potassium: 4.1 mmol/L (ref 3.5–5.1)
SODIUM: 138 mmol/L (ref 135–145)

## 2018-02-26 LAB — TROPONIN I: Troponin I: <0.03 ng/mL (ref <0.03)

## 2018-02-26 LAB — LIPASE, BLOOD: Lipase: 32 U/L (ref 11–51)

## 2018-02-26 NOTE — Discharge Instructions (Addendum)
Please make an appointment to follow-up with your regular doctor in 1 week for reevaluation.  Return to the emergency department for any continued chest pain, shortness of breath, lightheadedness, sweating, nausea or any new or worsening symptoms.

## 2018-02-26 NOTE — ED Provider Notes (Signed)
Medical screening examination/treatment/procedure(s) were conducted as a shared visit with non-physician practitioner(s) and myself.  I personally evaluated the patient during the encounter.  EKG Interpretation  Date/Time:  Saturday February 26 2018 15:03:31 EST Ventricular Rate:  66 PR Interval:  134 QRS Duration: 84 QT Interval:  388 QTC Calculation: 406 R Axis:   69 Text Interpretation:  Normal sinus rhythm Normal ECG n change Confirmed by Arby Barrette [phone]) on 02/26/2018 5:04:32 PM  Reports this morning she had been up and was getting ready to make some food.  She reports she got a chest pain in her center chest slightly to the left.  She reports there was a lot of pressure and she was worried it was her heart.  She reports since then though she is started belching and since being in the emergency department all of the pain has resolved.  She is alert nontoxic.  She is clinically well in appearance.  Heart is regular no gross rub murmur gallop.  Lungs are clear.  Abdomen is soft and nontender.  No peripheral edema or calf tenderness.  EKG is normal without changes from previous.  Patient is pain-free.  Agree with delta troponin.  If within normal limits patient stable for discharge and close outpatient follow-up.   Arby Barrette, MD 02/26/18 [phone]

## 2018-02-26 NOTE — ED Notes (Signed)
Pt on auto VS  

## 2018-02-26 NOTE — ED Triage Notes (Addendum)
Pt reports burping and iindigestion today. Also reports "sticking"  pain in chest which is new. Pt speaking full sentences, NAD

## 2018-02-26 NOTE — ED Provider Notes (Signed)
[facility] EMERGENCY DEPARTMENT Provider Note   CSN: [id number] Arrival date & time: 02/26/18  1455     History   Chief Complaint Chief Complaint  Patient presents with  . Chest Pain    HPI Mia Garcia is a 85 y.o. female.  HPI   Patient is a 85 year old female with a history of GI bleed, hypertension, CKD, who presents to the emergency department today for evaluation of right-sided chest pain that began around 1:00 today.  Patient describes the pain as a stabbing pain.  It was initially 10/10.  It did not radiate.  It was not associated with shortness of breath, nausea, diaphoresis, dizziness or lightheadedness.  Pain lasted for a few hours.  She states she thought it was her indigestion and she took some Mylanta prior to arrival.  When she arrived to the ED she began belching and states that her pain improved and has now resolved.  She states that all she has eaten today as applesauce however she did have fried fish before she went to bed last night.  She denies fevers, URI symptoms, abdominal pain, vomiting, diarrhea or urinary symptoms.  She is denies lower extremity swelling or pain.  She took 81mg  ASA today. Denies h/o DM, HLD. No smoking hx.  Past Medical History:  Diagnosis Date  . GI bleed   . Hypertension   . Renal disorder     Patient Active Problem List   Diagnosis Date Noted  . Dizziness and giddiness 05/16/2013  . Abnormality of gait 05/16/2013    Past Surgical History:  Procedure Laterality Date  . ABDOMINAL HYSTERECTOMY    . ABDOMINAL SURGERY    . ANKLE SURGERY    . APPENDECTOMY       OB History   No obstetric history on file.      Home Medications    Prior to Admission medications   Medication Sig Start Date End Date Taking? Authorizing Provider  aluminum & magnesium hydroxide-simethicone (MYLANTA) 500-450-40 MG/5ML suspension Take 5 mLs by mouth every 6 (six) hours as needed. For heartburn.    [provider]  aspirin  EC 81 MG tablet Take 81 mg by mouth daily.    [provider]  Cholecalciferol (VITAMIN D PO) Take by mouth.    [provider]  lansoprazole (PREVACID) 15 MG capsule Take 15 mg by mouth daily. For acid reflux.    [provider]  metoprolol succinate (TOPROL-XL) 50 MG 24 hr tablet Take 50 mg by mouth daily. Take with or immediately following a meal.    [provider]  Multiple Vitamin (MULITIVITAMIN WITH MINERALS) TABS Take 1 tablet by mouth daily.    [provider]  pravastatin (PRAVACHOL) 40 MG tablet Take 40 mg by mouth daily.    [provider]    Family History Family History  Family history unknown: Yes    Social History Social History   Tobacco Use  . Smoking status: Never Smoker  . Smokeless tobacco: Never Used  Substance Use Topics  . Alcohol use: No  . Drug use: No     Allergies   Gabapentin   Review of Systems Review of Systems  Constitutional: Negative for chills and fever.  HENT: Negative for ear pain and sore throat.   Eyes: Negative for visual disturbance.  Respiratory: Negative for cough and shortness of breath.   Cardiovascular: Positive for chest pain. Negative for palpitations and leg swelling.  Gastrointestinal: Negative for abdominal pain, blood in  stool, constipation, diarrhea, nausea and vomiting.  Genitourinary: Negative for dysuria and hematuria.  Musculoskeletal: Negative for back pain.  Skin: Negative for rash.  Neurological: Negative for headaches.  All other systems reviewed and are negative.    Physical Exam Updated Vital Signs BP (!) 156/78 (BP Location: Right Arm)   Pulse 60   Temp 98.7 F (37.1 C) (Oral)   Resp 16   Ht 5\' 2"  (1.575 m)   SpO2 100%   BMI 29.63 kg/m   Physical Exam Vitals signs and nursing note reviewed.  Constitutional:      General: She is not in acute distress.    Appearance: She is well-developed. She is not ill-appearing or toxic-appearing.  HENT:       Head: Normocephalic and atraumatic.     Mouth/Throat:     Mouth: Mucous membranes are dry.  Eyes:     Conjunctiva/sclera: Conjunctivae normal.  Neck:     Musculoskeletal: Neck supple.  Cardiovascular:     Rate and Rhythm: Normal rate and regular rhythm.     Heart sounds: Normal heart sounds. No murmur.  Pulmonary:     Effort: Pulmonary effort is normal. No respiratory distress.     Breath sounds: Normal breath sounds. No decreased breath sounds, wheezing, rhonchi or rales.  Abdominal:     Palpations: Abdomen is soft.     Tenderness: There is no abdominal tenderness.  Musculoskeletal:     Comments: Trace BLE edema. No calf TTP or erythema  Skin:    General: Skin is warm and dry.  Neurological:     Mental Status: She is alert.     Comments: Answers questions appropriately  Psychiatric:        Mood and Affect: Mood normal.      ED Treatments / Results  Labs (all labs ordered are listed, but only abnormal results are displayed) Labs Reviewed  BASIC METABOLIC PANEL - Abnormal; Notable for the following components:      Result Value   Creatinine, Ser 1.43 (*)    GFR calc non Af Amer 31 (*)    GFR calc Af Amer 36 (*)    All other components within normal limits  CBC - Abnormal; Notable for the following components:   RBC 3.72 (*)    Hemoglobin 10.8 (*)    HCT 35.9 (*)    All other components within normal limits  TROPONIN I  LIPASE, BLOOD  HEPATIC FUNCTION PANEL  TROPONIN I    EKG EKG Interpretation  Date/Time:  Saturday February 26 2018 15:03:31 EST Ventricular Rate:  66 PR Interval:  134 QRS Duration: 84 QT Interval:  388 QTC Calculation: 406 R Axis:   69 Text Interpretation:  Normal sinus rhythm Normal ECG n change Confirmed by Arby Barrette [phone]) on 02/26/2018 5:04:32 PM   Radiology Dg Chest 2 View  Result Date: 02/26/2018 CLINICAL DATA:  Mid chest pain beginning today. EXAM: CHEST - 2 VIEW COMPARISON:  05/11/2013 FINDINGS: Cardiomegaly. Aortic  atherosclerosis. Mild venous hypertension but no interstitial or alveolar edema. No effusions. No infiltrate or collapse. No significant bone finding. IMPRESSION: Cardiomegaly. Aortic atherosclerosis. Mild venous hypertension without edema. Electronically Signed   By: Paulina Fusi M.D.   On: 02/26/2018 15:44    Procedures Procedures (including critical care time)  Medications Ordered in ED Medications - No data to display   Initial Impression / Assessment and Plan / ED Course  I have reviewed the triage vital signs and the nursing notes.  Pertinent  labs & imaging results that were available during my care of the patient were reviewed by me and considered in my medical decision making (see chart for details).     Final Clinical Impressions(s) / ED Diagnoses   Final diagnoses:  Atypical chest pain   Patient is presents c/o stabbing, non-radiating, right-sided chest pain that began around 1:00 today. Pain initially 10/10, now 0/10. No associated SOB, nausea, diaphoresis, dizziness or lightheadedness. She states she thought it was her indigestion and she took some Mylanta prior to arrival.  When she arrived to the ED she began belching and states that her pain improved and has now resolved.  CBC without leukocytosis. Mild anemia, appears stable.  BMP with normal electrolytes, elevated Cr that is also stable.  Hepatic function panel WNL Lipase WNL Initial trop normal. Delta trop negative. EKG with NSR, no ischemic changes.  CXR with cardiomegaly, aortic atherosclerosis, and mild venous hypertension without edema.  Pt sxs seem atypical for ACS.  Her cardiac work-up is negative today.  Doubt PE or other acute cardiac or pulmonary etiology that would require further work-up or admission to the hospital at this time.  Given patient's report of improvement of pain after taking Mylanta and belching, think that her symptoms are more consistent with acid reflux.  Will advised her to take  over-the-counter antacid reducers.  Will advised to follow-up with her regular doctor in regards to her symptoms.  Have advised to return to the ER for new or worsening symptoms.  She voices understanding the plan reasons to return the ED.  All questions answered.  Dr. Clarice PolePfeifer evaluated the patient and is in agreement with this plan.  ED Discharge Orders    None       Rayne DuCouture, Alexiana Laverdure S, PA-C 02/26/18 1951    Arby BarrettePfeiffer, Marcy, MD 02/26/18 [phone]

## 2018-03-29 ENCOUNTER — Emergency Department (HOSPITAL_BASED_OUTPATIENT_CLINIC_OR_DEPARTMENT_OTHER): Payer: Medicare Other

## 2018-03-29 ENCOUNTER — Encounter (HOSPITAL_BASED_OUTPATIENT_CLINIC_OR_DEPARTMENT_OTHER): Payer: Self-pay

## 2018-03-29 ENCOUNTER — Other Ambulatory Visit: Payer: Self-pay

## 2018-03-29 ENCOUNTER — Emergency Department (HOSPITAL_BASED_OUTPATIENT_CLINIC_OR_DEPARTMENT_OTHER)
Admission: EM | Admit: 2018-03-29 | Discharge: 2018-03-29 | Disposition: A | Discharge status: Home / Self Care | Payer: Medicare Other | Attending: Emergency Medicine | Admitting: Emergency Medicine

## 2018-03-29 DIAGNOSIS — Z79899 Other long term (current) drug therapy: Secondary | ICD-10-CM | POA: Insufficient documentation

## 2018-03-29 DIAGNOSIS — I10 Essential (primary) hypertension: Secondary | ICD-10-CM

## 2018-03-29 DIAGNOSIS — R42 Dizziness and giddiness: Secondary | ICD-10-CM

## 2018-03-29 DIAGNOSIS — Z7982 Long term (current) use of aspirin: Secondary | ICD-10-CM | POA: Diagnosis not present

## 2018-03-29 DIAGNOSIS — H539 Unspecified visual disturbance: Secondary | ICD-10-CM | POA: Diagnosis not present

## 2018-03-29 DIAGNOSIS — R5383 Other fatigue: Secondary | ICD-10-CM | POA: Insufficient documentation

## 2018-03-29 LAB — DIFFERENTIAL
Abs Immature Granulocytes: 0.01 10*3/uL (ref 0.00–0.07)
BASOS ABS: 0 10*3/uL (ref 0.0–0.1)
Basophils Relative: 1 %
Eosinophils Absolute: 0.3 10*3/uL (ref 0.0–0.5)
Eosinophils Relative: 6 %
Immature Granulocytes: 0 %
Lymphocytes Relative: 37 %
Lymphs Abs: 1.8 10*3/uL (ref 0.7–4.0)
Monocytes Absolute: 0.6 10*3/uL (ref 0.1–1.0)
Monocytes Relative: 11 %
Neutro Abs: 2.2 10*3/uL (ref 1.7–7.7)
Neutrophils Relative %: 45 %

## 2018-03-29 LAB — TROPONIN I: Troponin I: <0.03 ng/mL (ref <0.03)

## 2018-03-29 LAB — COMPREHENSIVE METABOLIC PANEL
ALT: 11 U/L (ref 0–44)
AST: 24 U/L (ref 15–41)
Albumin: 3.7 g/dL (ref 3.5–5.0)
Alkaline Phosphatase: 68 U/L (ref 38–126)
Anion gap: 7 (ref 5–15)
BUN: 16 mg/dL (ref 8–23)
CO2: 23 mmol/L (ref 22–32)
CREATININE: 1.31 mg/dL — AB (ref 0.44–1.00)
Calcium: 9 mg/dL (ref 8.9–10.3)
Chloride: 107 mmol/L (ref 98–111)
GFR calc Af Amer: 41 mL/min — ABNORMAL LOW (ref >60)
GFR calc non Af Amer: 35 mL/min — ABNORMAL LOW (ref >60)
Glucose, Bld: 142 mg/dL — ABNORMAL HIGH (ref 70–99)
Potassium: 3.5 mmol/L (ref 3.5–5.1)
SODIUM: 137 mmol/L (ref 135–145)
Total Bilirubin: 1 mg/dL (ref 0.3–1.2)
Total Protein: 7.3 g/dL (ref 6.5–8.1)

## 2018-03-29 LAB — URINALYSIS, ROUTINE W REFLEX MICROSCOPIC
Bilirubin Urine: NEGATIVE
Glucose, UA: NEGATIVE mg/dL
Hgb urine dipstick: NEGATIVE
Ketones, ur: NEGATIVE mg/dL
Nitrite: NEGATIVE
Protein, ur: NEGATIVE mg/dL
Specific Gravity, Urine: 1.02 (ref 1.005–1.030)
pH: 6 (ref 5.0–8.0)

## 2018-03-29 LAB — URINALYSIS, MICROSCOPIC (REFLEX)

## 2018-03-29 LAB — CBC
HCT: 36.7 % (ref 36.0–46.0)
Hemoglobin: 11.2 g/dL — ABNORMAL LOW (ref 12.0–15.0)
MCH: 29.1 pg (ref 26.0–34.0)
MCHC: 30.5 g/dL (ref 30.0–36.0)
MCV: 95.3 fL (ref 80.0–100.0)
Platelets: 181 10*3/uL (ref 150–400)
RBC: 3.85 MIL/uL — ABNORMAL LOW (ref 3.87–5.11)
RDW: 12.7 % (ref 11.5–15.5)
WBC: 4.9 10*3/uL (ref 4.0–10.5)
nRBC: 0 % (ref 0.0–0.2)

## 2018-03-29 LAB — APTT: aPTT: 29 seconds (ref 24–36)

## 2018-03-29 LAB — PROTIME-INR
INR: 1.15
Prothrombin Time: 14.6 seconds (ref 11.4–15.2)

## 2018-03-29 LAB — CBG MONITORING, ED: Glucose-Capillary: 122 mg/dL — ABNORMAL HIGH (ref 70–99)

## 2018-03-29 LAB — TSH: TSH: 2.412 u[IU]/mL (ref 0.350–4.500)

## 2018-03-29 MED ORDER — LABETALOL HCL 5 MG/ML IV SOLN
5.0000 mg | Freq: Once | INTRAVENOUS | Status: AC
  Administered 2018-03-29: 5 mg via INTRAVENOUS
  Filled 2018-03-29: qty 4

## 2018-03-29 MED ORDER — SODIUM CHLORIDE 0.9% FLUSH
3.0000 mL | Freq: Once | INTRAVENOUS | Status: DC
  Filled 2018-03-29: qty 3

## 2018-03-29 NOTE — ED Notes (Signed)
Notified provider of elevated BP.

## 2018-03-29 NOTE — ED Provider Notes (Addendum)
[facility] EMERGENCY DEPARTMENT Provider Note   CSN: [id number] Arrival date & time: 03/29/18  1447     History   Chief Complaint Chief Complaint  Patient presents with  . Dizziness    HPI Mia Garcia is a 85 y.o. female.  The history is provided by the patient and medical records.  Dizziness  Quality:  Lightheadedness Severity:  Severe Onset quality:  Sudden Duration: 45 mins ago. Timing:  Rare Progression:  Resolved Chronicity:  New Relieved by:  Nothing Worsened by:  Nothing Ineffective treatments:  None tried Associated symptoms: vision changes   Associated symptoms: no chest pain, no diarrhea, no headaches, no nausea, no palpitations, no shortness of breath, no syncope, no tinnitus, no vomiting and no weakness   Risk factors: no hx of stroke     Past Medical History:  Diagnosis Date  . GI bleed   . Hypertension   . Renal disorder     Patient Active Problem List   Diagnosis Date Noted  . Dizziness and giddiness 05/16/2013  . Abnormality of gait 05/16/2013    Past Surgical History:  Procedure Laterality Date  . ABDOMINAL HYSTERECTOMY    . ABDOMINAL SURGERY    . ANKLE SURGERY    . APPENDECTOMY       OB History   No obstetric history on file.      Home Medications    Prior to Admission medications   Medication Sig Start Date End Date Taking? Authorizing Provider  aluminum & magnesium hydroxide-simethicone (MYLANTA) 500-450-40 MG/5ML suspension Take 5 mLs by mouth every 6 (six) hours as needed. For heartburn.    [provider]  aspirin EC 81 MG tablet Take 81 mg by mouth daily.    [provider]  Cholecalciferol (VITAMIN D PO) Take by mouth.    [provider]  lansoprazole (PREVACID) 15 MG capsule Take 15 mg by mouth daily. For acid reflux.    [provider]  metoprolol succinate (TOPROL-XL) 50 MG 24 hr tablet Take 50 mg by mouth daily. Take with or immediately following a meal.    [provider]  Multiple Vitamin (MULITIVITAMIN WITH MINERALS) TABS Take 1 tablet by mouth daily.    [provider]  pravastatin (PRAVACHOL) 40 MG tablet Take 40 mg by mouth daily.    [provider]    Family History Family History  Family history unknown: Yes    Social History Social History   Tobacco Use  . Smoking status: Never Smoker  . Smokeless tobacco: Never Used  Substance Use Topics  . Alcohol use: No  . Drug use: No     Allergies   Gabapentin   Review of Systems Review of Systems  Constitutional: Positive for fatigue. Negative for chills, diaphoresis and fever.  HENT: Negative for congestion, rhinorrhea and tinnitus.   Eyes: Positive for visual disturbance. Negative for photophobia.  Respiratory: Negative for cough, chest tightness, shortness of breath, wheezing and stridor.   Cardiovascular: Negative for chest pain, palpitations and syncope.  Gastrointestinal: Negative for abdominal pain, constipation, diarrhea, nausea and vomiting.  Genitourinary: Negative for dysuria, flank pain and frequency.  Musculoskeletal: Negative for back pain, neck pain and neck stiffness.  Skin: Negative for rash and wound.  Neurological: Positive for light-headedness. Negative for dizziness, syncope, speech difficulty, weakness, numbness and headaches.  Psychiatric/Behavioral: Negative for agitation.  All other systems reviewed and are negative.    Physical Exam Updated Vital Signs BP (!) 200/80 (BP Location: Right  Arm)   Pulse 73   Temp 98.3 F (36.8 C) (Oral)   Resp 20   Ht 5\' 2"  (1.575 m)   Wt 69.4 kg   SpO2 100%   BMI 27.98 kg/m   Physical Exam Vitals signs and nursing note reviewed.  Constitutional:      General: She is not in acute distress.    Appearance: She is not ill-appearing, toxic-appearing or diaphoretic.  HENT:     Head: Normocephalic.     Nose: No congestion or rhinorrhea.     Mouth/Throat:     Pharynx: No oropharyngeal exudate  or posterior oropharyngeal erythema.  Eyes:     General: No visual field deficit.    Extraocular Movements: Extraocular movements intact.     Conjunctiva/sclera: Conjunctivae normal.     Pupils: Pupils are equal, round, and reactive to light.  Neck:     Musculoskeletal: No neck rigidity or muscular tenderness.  Cardiovascular:     Rate and Rhythm: Normal rate.     Pulses: Normal pulses.     Heart sounds: No murmur.  Pulmonary:     Effort: Pulmonary effort is normal.     Breath sounds: No wheezing, rhonchi or rales.  Chest:     Chest wall: No tenderness.  Abdominal:     General: Abdomen is flat. There is no distension.     Tenderness: There is no abdominal tenderness.  Musculoskeletal:        General: No tenderness.  Skin:    Capillary Refill: Capillary refill takes less than 2 seconds.  Neurological:     General: No focal deficit present.     Mental Status: She is alert and oriented to person, place, and time.     GCS: GCS eye subscore is 4. GCS verbal subscore is 5. GCS motor subscore is 6.     Cranial Nerves: No cranial nerve deficit, dysarthria or facial asymmetry.     Sensory: Sensation is intact. No sensory deficit.     Motor: No weakness, abnormal muscle tone or seizure activity.     Coordination: Finger-Nose-Finger Test normal.      ED Treatments / Results  Labs (all labs ordered are listed, but only abnormal results are displayed) Labs Reviewed  CBC - Abnormal; Notable for the following components:      Result Value   RBC 3.85 (*)    Hemoglobin 11.2 (*)    All other components within normal limits  COMPREHENSIVE METABOLIC PANEL - Abnormal; Notable for the following components:   Glucose, Bld 142 (*)    Creatinine, Ser 1.31 (*)    GFR calc non Af Amer 35 (*)    GFR calc Af Amer 41 (*)    All other components within normal limits  URINALYSIS, ROUTINE W REFLEX MICROSCOPIC - Abnormal; Notable for the following components:   Leukocytes, UA TRACE (*)    All  other components within normal limits  URINALYSIS, MICROSCOPIC (REFLEX) - Abnormal; Notable for the following components:   Bacteria, UA RARE (*)    All other components within normal limits  CBG MONITORING, ED - Abnormal; Notable for the following components:   Glucose-Capillary 122 (*)    All other components within normal limits  URINE CULTURE  PROTIME-INR  APTT  DIFFERENTIAL  TROPONIN I  TSH  CBG MONITORING, ED    EKG EKG Interpretation  Date/Time:  Tuesday March 29 2018 15:07:04 EST Ventricular Rate:  70 PR Interval:  132 QRS Duration: 84 QT Interval:  410 QTC Calculation: 442 R Axis:   63 Text Interpretation:  Normal sinus rhythm Normal ECG When compared to prior, no significant changes seen.  No STEMI Confirmed by Theda Belfast ([id number]) on 03/29/2018 3:18:04 PM   Radiology Ct Head Wo Contrast  Result Date: 03/29/2018 CLINICAL DATA:  Transient loss of vision today. EXAM: CT HEAD WITHOUT CONTRAST TECHNIQUE: Contiguous axial images were obtained from the base of the skull through the vertex without intravenous contrast. COMPARISON:  07/06/2014. FINDINGS: Brain: No significant change in mild to moderate enlargement of the ventricles and subarachnoid spaces and mild to moderate patchy white matter low density in both cerebral hemispheres. No intracranial hemorrhage, mass lesion or CT evidence of acute infarction. Vascular: No hyperdense vessel or unexpected calcification. Skull: Normal. Negative for fracture or focal lesion. Sinuses/Orbits: Status post bilateral cataract extraction. Unremarkable included paranasal sinuses. Other: None. IMPRESSION: 1. No acute abnormality. 2. Stable atrophy and chronic small vessel white matter ischemic changes. Electronically Signed   By: Beckie Salts M.D.   On: 03/29/2018 15:31    Procedures Procedures (including critical care time)  CRITICAL CARE Performed by: Canary Brim Tristy Udovich Total critical care time: 35 minutes Critical care time  was exclusive of separately billable procedures and treating other patients. Critical care was necessary to treat or prevent imminent or life-threatening deterioration. Critical care was time spent personally by me on the following activities: development of treatment plan with patient and/or surrogate as well as nursing, discussions with consultants, evaluation of patient's response to treatment, examination of patient, obtaining history from patient or surrogate, ordering and performing treatments and interventions, ordering and review of laboratory studies, ordering and review of radiographic studies, pulse oximetry and re-evaluation of patient's condition.  Medications Ordered in ED Medications  sodium chloride flush (NS) 0.9 % injection 3 mL (3 mLs Intravenous Not Given 03/29/18 1533)  labetalol (NORMODYNE,TRANDATE) injection 5 mg (5 mg Intravenous Given 03/29/18 1541)     Initial Impression / Assessment and Plan / ED Course  I have reviewed the triage vital signs and the nursing notes.  Pertinent labs & imaging results that were available during my care of the patient were reviewed by me and considered in my medical decision making (see chart for details).     Mia Garcia is a 85 y.o. female with a PMH significant for HTN with R renal artery stenosis, HLD, prior posterior vitreous detachments of both eyes, prior GI bleed, and CKD who presents for sudden lightheadedness and bilateral blurry vision. Pt reports she was at [employer] for lunch at 2:30 PM (45 min ago) when she had sudden onset of lightheadedness and bilateral blurry vision. Pt reports this is new. She presents to the ED for eval. No hx of stroke.   On arrival, lightheadeness has improved. No spinning dizziness reported. Vision has improved in both eyes. Pt denies diplopia. No reported numbness, tingling, or weaknes of any extremities. No preceding headache, trauma, or orther complaints.   On arrival glucose was 122 and BP  was elevated at 200 systolic.   I evaluated pt in triage and do not feel she is a code stroke. On my exam, no focal neunrologic deficits are seen. Normal pupils and EOM. Normal visual fields. n ofacial droop. Speech clear. No numbness or weaknes on exam of extremitieis. Gait initia-lly deferred. PT had normal finger nose finger testing.    Symptoms have resolved. Suspect TIA vs hypertensive emergengy primarily based on imoproved symptoms. Less likely vitreous detachment again as seen  in chart given improved symptoms.   Pt will get stroke/TIA workup inclusing CT.head. will treat BP. Anticipate admission.    7:43 PM Diagnostic testing was overall reassuring.  Troponin negative.  Metabolic panel shows similar and improved creatinine from prior 1.31.  CBC shows improved anemia.  Urinalysis showed no nitrites.  Given lack of urinary symptoms, doubt UTI.  CT head showed no acute bleeding or significant amount.  There was evidence of atrophy and chronic small vessel disease.  Blood pressure improved significantly after labetalol.  Patient's symptoms of vision changes remained resolved.  No further lightheadedness.  Suspect hypertensive emergency which has improved significantly here in the emergency department.  Neurology was called who did not feel she had a TIA.  They did not feel she needed a CTA or MRI at this time.  Patient's kidney function was borderline to get CTA.  Given her well appearance for several hours and improved symptoms, patient was felt to appropriate for discharge home with plans to follow-up with her PCP and outpatient neurology.  Patient was understanding this plan do not continue to feel better.  Patient will continue her home medicines and understood return precautions.  Patient discharged in good condition.   Final Clinical Impressions(s) / ED Diagnoses   Final diagnoses:  Lightheadedness  Transient vision disturbance of both eyes  Hypertension, unspecified type    ED  Discharge Orders    None      Clinical Impression: 1. Lightheadedness   2. Transient vision disturbance of both eyes   3. Hypertension, unspecified type     Disposition: Discharge  Condition: Good  I have discussed the results, Dx and Tx plan with the pt(& family if present). He/she/they expressed understanding and agree(s) with the plan. Discharge instructions discussed at great length. Strict return precautions discussed and pt &/or family have verbalized understanding of the instructions. No further questions at time of discharge.    New Prescriptions   No medications on file    Follow Up: Brooke BonitoGallemore, Warren, MD     [facility] [address]     [address] [place] [phone]       Calirose Mccance, Canary Brimhristopher J, MD 03/30/18 0007    Clara Herbison, Canary Brimhristopher J, MD 04/09/18 2031

## 2018-03-29 NOTE — ED Triage Notes (Signed)
Pt c/o dizziness and "my vision left me", weakness-started ~230pm while she was at lunch-pt to triage in w/c-alert-oriented to name DOB place-NAD-states "just don't feel good", nausea

## 2018-03-29 NOTE — ED Notes (Signed)
PO challenge provided

## 2018-03-29 NOTE — Discharge Instructions (Signed)
Your work-up today and history was consistent with a episode of hypertensive emergency likely due to your high blood pressure causing the vision changes and lightheadedness.  As your blood pressure improved with medication and you have had resolution of your symptoms, we do not feel you needs admission at this time.  Please continue your home medications and follow-up with up with your PCP and an outpatient neurologist.  If any symptoms change or worsen or recur, please return to the nearest emergency department immediately.

## 2018-03-29 NOTE — ED Notes (Signed)
Code stroke cancelled per Dr. Rush Landmark

## 2018-03-29 NOTE — ED Notes (Signed)
EDP Tegeler in triage

## 2018-03-31 LAB — URINE CULTURE: Culture: NO GROWTH

## 2018-04-04 ENCOUNTER — Ambulatory Visit: Payer: Medicare Other

## 2018-04-06 ENCOUNTER — Ambulatory Visit
Admission: RE | Admit: 2018-04-06 | Discharge: 2018-04-06 | Disposition: A | Discharge status: Home / Self Care | Payer: Medicare Other | Source: Ambulatory Visit | Attending: Internal Medicine | Admitting: Internal Medicine

## 2018-04-06 DIAGNOSIS — Z1231 Encounter for screening mammogram for malignant neoplasm of breast: Secondary | ICD-10-CM

## 2018-11-12 ENCOUNTER — Emergency Department (HOSPITAL_BASED_OUTPATIENT_CLINIC_OR_DEPARTMENT_OTHER)
Admission: EM | Admit: 2018-11-12 | Discharge: 2018-11-12 | Disposition: A | Discharge status: Home / Self Care | Payer: Medicare Other | Attending: Emergency Medicine | Admitting: Emergency Medicine

## 2018-11-12 ENCOUNTER — Other Ambulatory Visit: Payer: Self-pay

## 2018-11-12 ENCOUNTER — Encounter (HOSPITAL_BASED_OUTPATIENT_CLINIC_OR_DEPARTMENT_OTHER): Payer: Self-pay | Admitting: *Deleted

## 2018-11-12 ENCOUNTER — Emergency Department (HOSPITAL_BASED_OUTPATIENT_CLINIC_OR_DEPARTMENT_OTHER): Payer: Medicare Other

## 2018-11-12 DIAGNOSIS — Y92000 Kitchen of unspecified non-institutional (private) residence as  the place of occurrence of the external cause: Secondary | ICD-10-CM | POA: Insufficient documentation

## 2018-11-12 DIAGNOSIS — W19XXXA Unspecified fall, initial encounter: Secondary | ICD-10-CM

## 2018-11-12 DIAGNOSIS — Z886 Allergy status to analgesic agent status: Secondary | ICD-10-CM | POA: Insufficient documentation

## 2018-11-12 DIAGNOSIS — S0990XA Unspecified injury of head, initial encounter: Secondary | ICD-10-CM | POA: Insufficient documentation

## 2018-11-12 DIAGNOSIS — Y939 Activity, unspecified: Secondary | ICD-10-CM | POA: Diagnosis not present

## 2018-11-12 DIAGNOSIS — Z7982 Long term (current) use of aspirin: Secondary | ICD-10-CM | POA: Insufficient documentation

## 2018-11-12 DIAGNOSIS — Y999 Unspecified external cause status: Secondary | ICD-10-CM | POA: Diagnosis not present

## 2018-11-12 DIAGNOSIS — I10 Essential (primary) hypertension: Secondary | ICD-10-CM | POA: Diagnosis not present

## 2018-11-12 DIAGNOSIS — Z79899 Other long term (current) drug therapy: Secondary | ICD-10-CM | POA: Insufficient documentation

## 2018-11-12 DIAGNOSIS — W010XXA Fall on same level from slipping, tripping and stumbling without subsequent striking against object, initial encounter: Secondary | ICD-10-CM | POA: Insufficient documentation

## 2018-11-12 NOTE — ED Notes (Signed)
ED Provider at bedside. 

## 2018-11-12 NOTE — ED Triage Notes (Signed)
Pt reports she stumbled and fell yesterday and hit the back of her head. She lives alone. States she has a knot on the back of her head

## 2018-11-12 NOTE — ED Provider Notes (Signed)
[facility] EMERGENCY DEPARTMENT Provider Note   CSN: [id number] Arrival date & time: 11/12/18  1310     History   Chief Complaint Chief Complaint  Patient presents with  . Fall    HPI Mia Garcia is a 85 y.o. female.  Presents ER with head pain after a fall.  Patient states she stumbled and fell yesterday in the kitchen, landing on the back of her head.  She did not have prolonged downtime, she does not suffer any other injuries by report.  She denied any neck pain, back, chest or abdominal pain.  Has been ambulating without difficulty.  No loss of consciousness, no vomiting.  No symptoms today.     HPI  Past Medical History:  Diagnosis Date  . GI bleed   . Hypertension   . Renal disorder     Patient Active Problem List   Diagnosis Date Noted  . Dizziness and giddiness 05/16/2013  . Abnormality of gait 05/16/2013    Past Surgical History:  Procedure Laterality Date  . ABDOMINAL HYSTERECTOMY    . ABDOMINAL SURGERY    . ANKLE SURGERY    . APPENDECTOMY       OB History   No obstetric history on file.      Home Medications    Prior to Admission medications   Medication Sig Start Date End Date Taking? Authorizing Provider  aluminum & magnesium hydroxide-simethicone (MYLANTA) 500-450-40 MG/5ML suspension Take 5 mLs by mouth every 6 (six) hours as needed. For heartburn.    [provider]  aspirin EC 81 MG tablet Take 81 mg by mouth daily.    [provider]  Cholecalciferol (VITAMIN D PO) Take by mouth.    [provider]  lansoprazole (PREVACID) 15 MG capsule Take 15 mg by mouth daily. For acid reflux.    [provider]  metoprolol succinate (TOPROL-XL) 50 MG 24 hr tablet Take 50 mg by mouth daily. Take with or immediately following a meal.    [provider]  Multiple Vitamin (MULITIVITAMIN WITH MINERALS) TABS Take 1 tablet by mouth daily.    [provider]  pravastatin (PRAVACHOL) 40 MG  tablet Take 40 mg by mouth daily.    [provider]    Family History Family History  Family history unknown: Yes    Social History Social History   Tobacco Use  . Smoking status: Never Smoker  . Smokeless tobacco: Never Used  Substance Use Topics  . Alcohol use: Not Currently  . Drug use: No     Allergies   Gabapentin   Review of Systems Review of Systems  Constitutional: Negative for chills and fever.  HENT: Negative for ear pain and sore throat.   Eyes: Negative for pain and visual disturbance.  Respiratory: Negative for cough and shortness of breath.   Cardiovascular: Negative for chest pain and palpitations.  Gastrointestinal: Negative for abdominal pain and vomiting.  Genitourinary: Negative for dysuria and hematuria.  Musculoskeletal: Negative for arthralgias and back pain.  Skin: Negative for color change and rash.  Neurological: Negative for seizures and syncope.  All other systems reviewed and are negative.    Physical Exam Updated Vital Signs BP (!) 168/99 (BP Location: Right Arm)   Pulse 66   Temp 98.2 F (36.8 C) (Oral)   Resp 18   Ht 5\' 4"  (1.626 m)   Wt 63.5 kg   SpO2 93%   BMI 24.03 kg/m   Physical Exam Vitals signs  and nursing note reviewed.  Constitutional:      General: She is not in acute distress.    Appearance: She is well-developed.  HENT:     Head: Normocephalic and atraumatic.  Eyes:     Conjunctiva/sclera: Conjunctivae normal.  Neck:     Musculoskeletal: Neck supple.  Cardiovascular:     Rate and Rhythm: Normal rate and regular rhythm.     Heart sounds: No murmur.  Pulmonary:     Effort: Pulmonary effort is normal. No respiratory distress.     Breath sounds: Normal breath sounds.  Abdominal:     Palpations: Abdomen is soft.     Tenderness: There is no abdominal tenderness.  Musculoskeletal:     Comments: No tenderness palpation throughout all 4 extremities, no obvious deformity noted, no tenderness palpation  in C, T, L-spine, distal sensation, distal motor, distal pulses intact in all 4 extremities  Skin:    General: Skin is warm and dry.  Neurological:     Mental Status: She is alert.      ED Treatments / Results  Labs (all labs ordered are listed, but only abnormal results are displayed) Labs Reviewed - No data to display  EKG None  Radiology Ct Head Wo Contrast  Result Date: 11/12/2018 CLINICAL DATA:  Fall, head trauma EXAM: CT HEAD WITHOUT CONTRAST TECHNIQUE: Contiguous axial images were obtained from the base of the skull through the vertex without intravenous contrast. COMPARISON:  03/29/2018 FINDINGS: Brain: No evidence of acute infarction, hemorrhage, hydrocephalus, extra-axial collection or mass lesion/mass effect. Periventricular white matter hypodensity. Vascular: No hyperdense vessel or unexpected calcification. Skull: Normal. Negative for fracture or focal lesion. Sinuses/Orbits: No acute finding. Other: None. IMPRESSION: No acute intracranial pathology.  Small-vessel white matter disease. Electronically Signed   By: Lauralyn Primes M.D.   On: 11/12/2018 14:50    Procedures Procedures (including critical care time)  Medications Ordered in ED Medications - No data to display   Initial Impression / Assessment and Plan / ED Course  I have reviewed the triage vital signs and the nursing notes.  Pertinent labs & imaging results that were available during my care of the patient were reviewed by me and considered in my medical decision making (see chart for details).  Clinical Course as of Nov 12 1618  Sat Nov 12, 2018  1455 Recheck patient, updated on results, will discharge home   [RD]    Clinical Course User Index [RD] Milagros Loll, MD      85 year old lady presents after mechanical fall yesterday, concern for head trauma.  On close head to toe examination, did not appreciate any significant trauma.  Patient had normal vital signs and was well-appearing.  Given  reported head trauma, age, will check CT head, this was negative for acute injury.  Based on my assessment do not see indication for additional emergent imaging at this time.  Reviewed return precautions, will discharge home.    After the discussed management above, the patient was determined to be safe for discharge.  The patient was in agreement with this plan and all questions regarding their care were answered.  ED return precautions were discussed and the patient will return to the ED with any significant worsening of condition.    Final Clinical Impressions(s) / ED Diagnoses   Final diagnoses:  Fall, initial encounter  Injury of head, initial encounter    ED Discharge Orders    None       Milagros Loll,  MD 11/12/18 1620

## 2018-11-12 NOTE — Discharge Instructions (Signed)
If you develop any chest pain, difficulty breathing, episodes of passing out, additional falls, or other new concerning symptom please return to ER for recheck.  Recommend taking Tylenol as needed for pain control.

## 2020-12-14 IMAGING — CT CT HEAD W/O CM
3 series · 16 of 47 positions shown, 19 images · non-contrast
Comparison: 03/29/2018

CLINICAL DATA: Fall, head trauma

EXAM:
CT HEAD WITHOUT CONTRAST
TECHNIQUE: Contiguous axial images were obtained from the base of the skull
through the vertex without intravenous contrast.

[Series 2: head wo · axial · 0.44mm/px · z∈[-461,-336]mm · 10 of 31 slices shown, 13 images]
[im 3/31  brain]
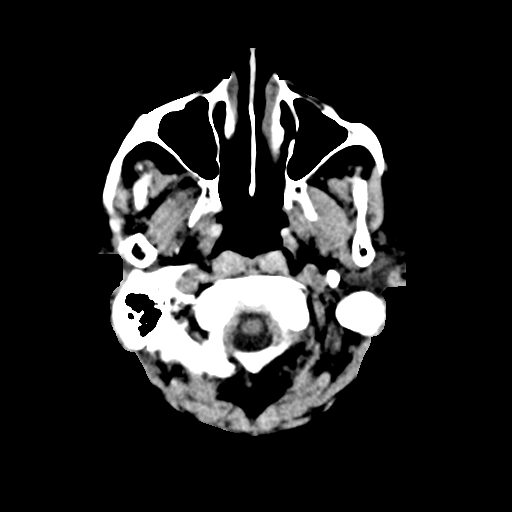
[im 3/31  bone]
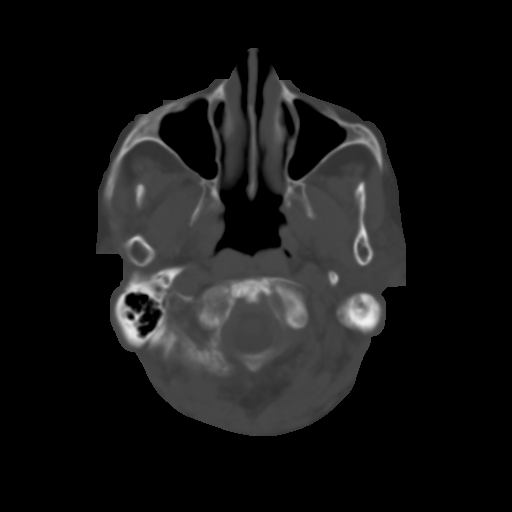
[im 6/31  brain]
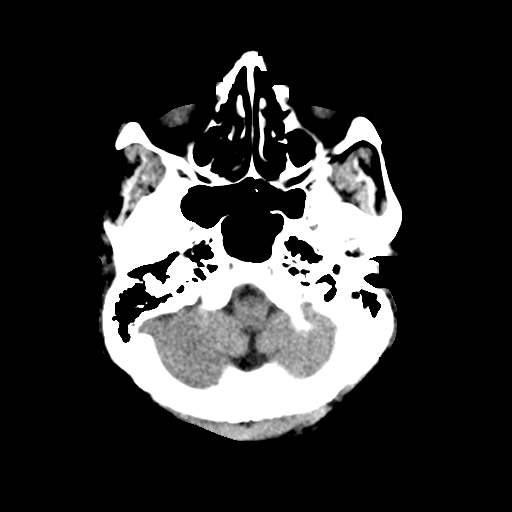
[im 9/31  brain]
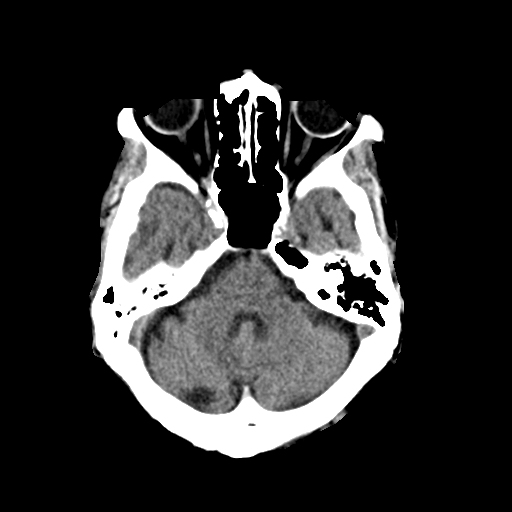
[im 11/31  brain]
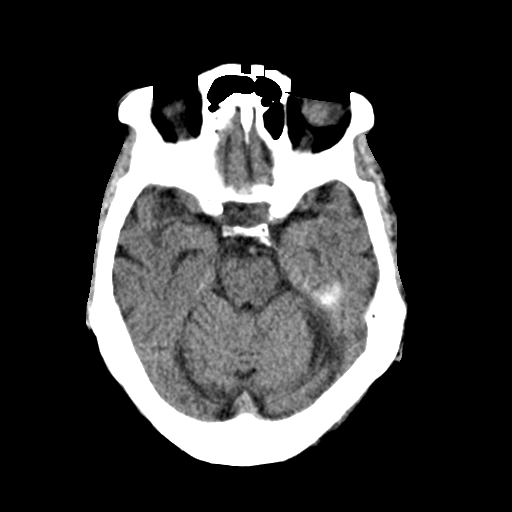
[im 14/31  brain]
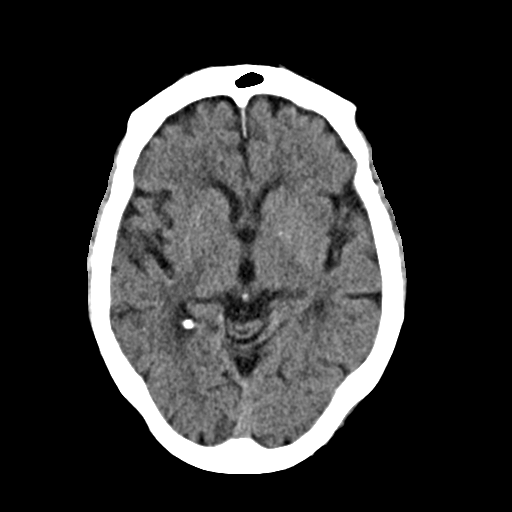
[im 14/31  bone]
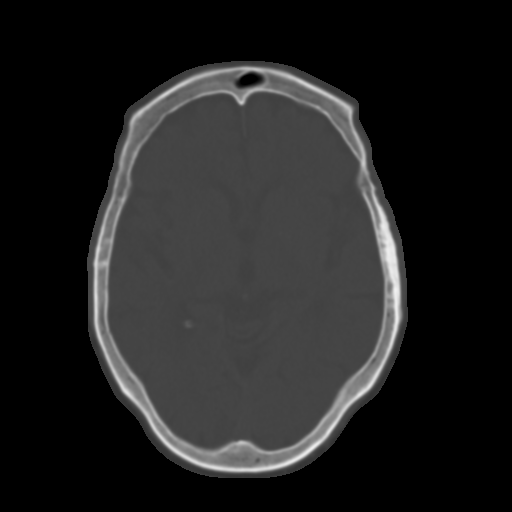
[im 17/31  brain]
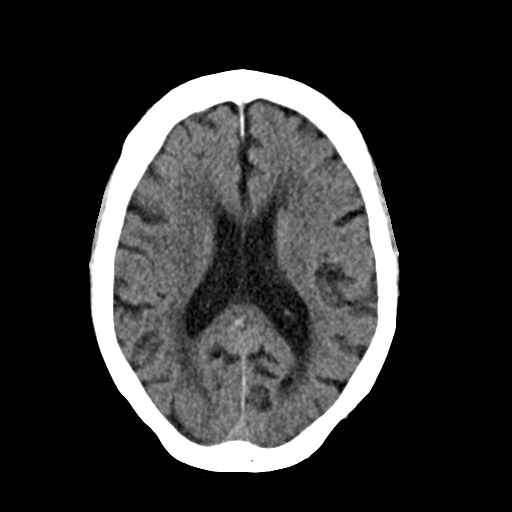
[im 20/31  brain]
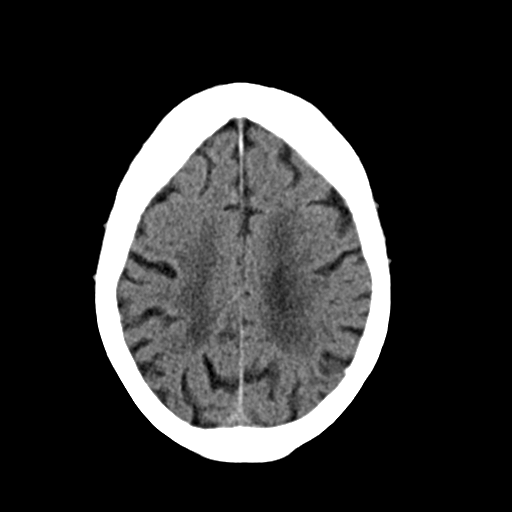
[im 23/31  brain]
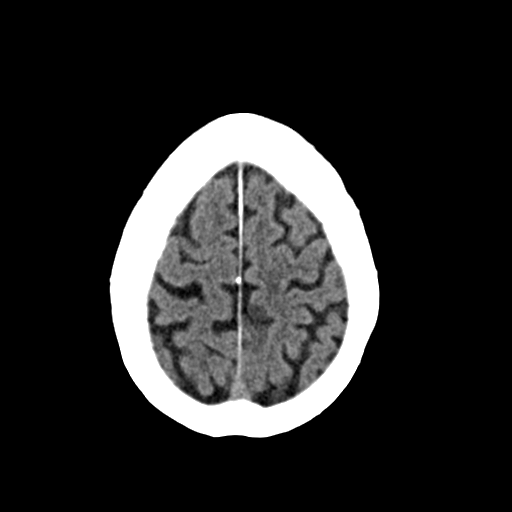
[im 25/31  brain]
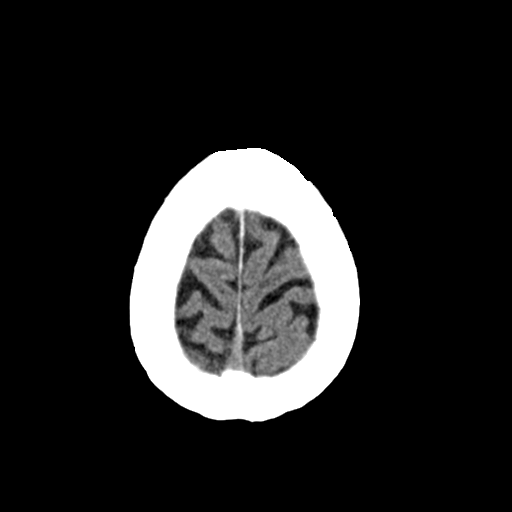
[im 25/31  bone]
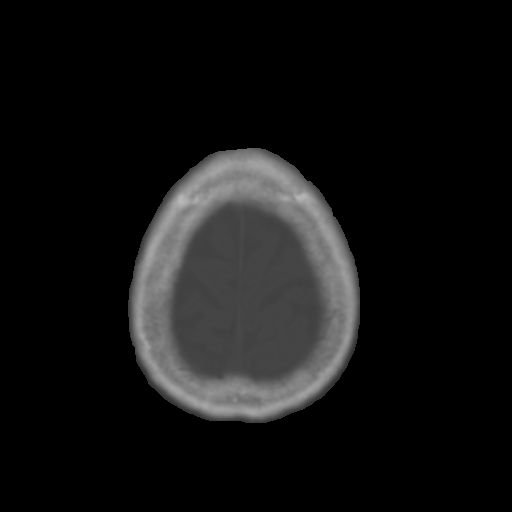
[im 28/31  brain]
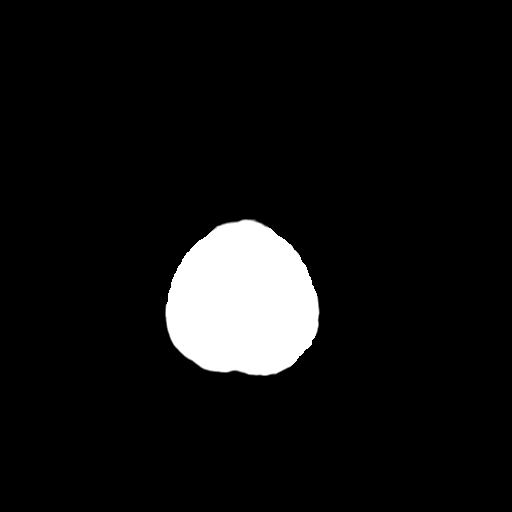

[Series 4: coronal soft · coronal · 0.32mm/px · 3 of 64 slices shown]
[im 22/64  brain]
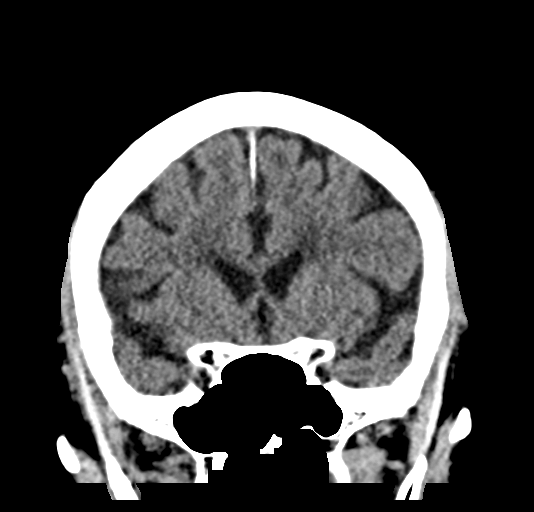
[im 29/64  brain]
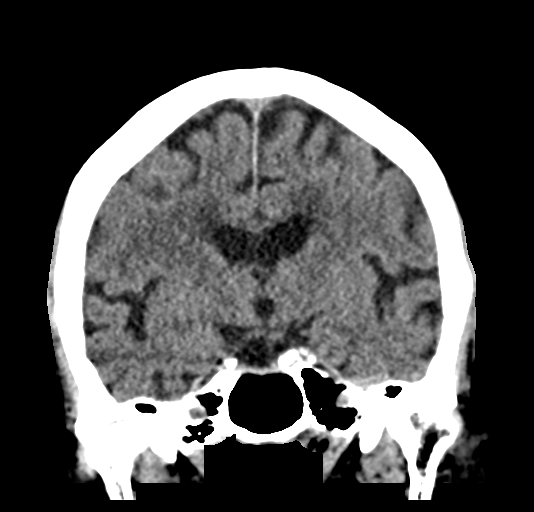
[im 36/64  brain]
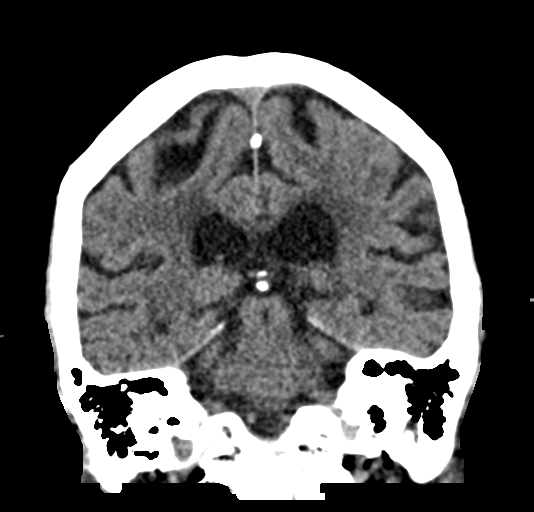

[Series 5: sag soft · sagittal · 0.30mm/px · 3 of 51 slices shown]
[im 17/51  brain]
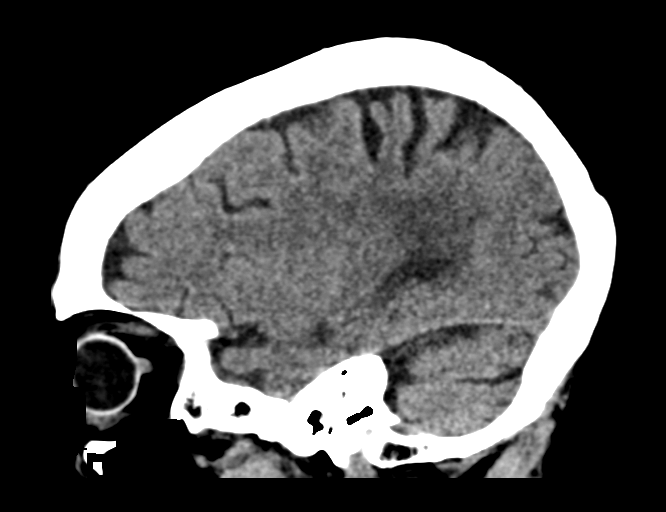
[im 26/51  brain]
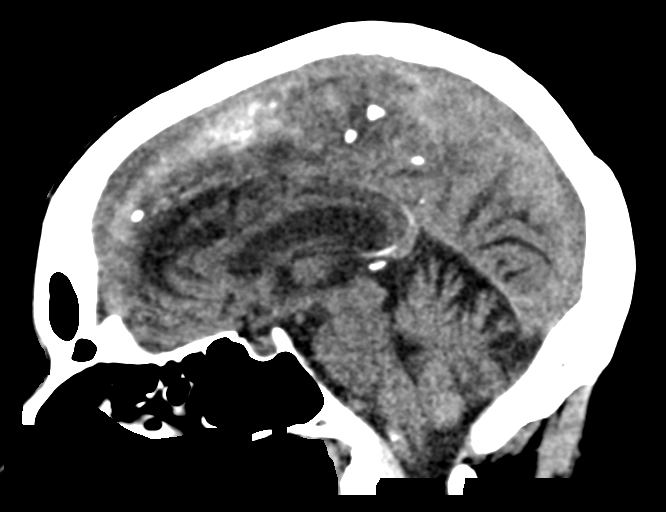
[im 34/51  brain]
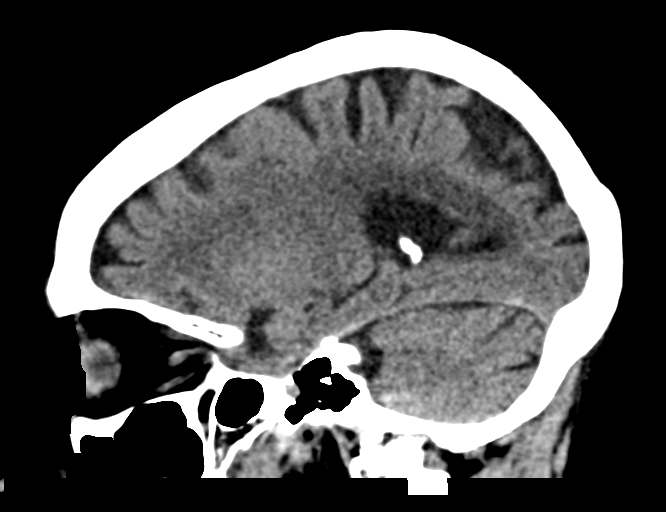

[16 of 47 positions shown; findings below may reference images not displayed]

FINDINGS: Brain: No evidence of acute infarction, hemorrhage, hydrocephalus,
extra-axial collection or mass lesion/mass effect. Periventricular
white matter hypodensity.

Vascular: No hyperdense vessel or unexpected calcification.

Skull: Normal. Negative for fracture or focal lesion.

Sinuses/Orbits: No acute finding.

Other: None.
IMPRESSION: No acute intracranial pathology.  Small-vessel white matter disease.

## 2021-05-24 DEATH — deceased
